# Patient Record
Sex: Female | Born: 1955 | Race: White | Hispanic: No | Marital: Married | Smoking: Never smoker
Health system: Southern US, Community
[De-identification: ages and names within clinical notes are randomized; demographics above are authoritative.]

## PROBLEM LIST (undated history)

## (undated) DIAGNOSIS — K5900 Constipation, unspecified: Secondary | ICD-10-CM

## (undated) DIAGNOSIS — H524 Presbyopia: Secondary | ICD-10-CM

## (undated) DIAGNOSIS — E785 Hyperlipidemia, unspecified: Secondary | ICD-10-CM

## (undated) DIAGNOSIS — I10 Essential (primary) hypertension: Secondary | ICD-10-CM

## (undated) DIAGNOSIS — F419 Anxiety disorder, unspecified: Secondary | ICD-10-CM

## (undated) DIAGNOSIS — Z87442 Personal history of urinary calculi: Secondary | ICD-10-CM

## (undated) DIAGNOSIS — E041 Nontoxic single thyroid nodule: Secondary | ICD-10-CM

## (undated) DIAGNOSIS — K219 Gastro-esophageal reflux disease without esophagitis: Secondary | ICD-10-CM

## (undated) DIAGNOSIS — H52209 Unspecified astigmatism, unspecified eye: Secondary | ICD-10-CM

## (undated) DIAGNOSIS — G4730 Sleep apnea, unspecified: Secondary | ICD-10-CM

## (undated) DIAGNOSIS — M1990 Unspecified osteoarthritis, unspecified site: Secondary | ICD-10-CM

## (undated) DIAGNOSIS — N200 Calculus of kidney: Secondary | ICD-10-CM

## (undated) DIAGNOSIS — C801 Malignant (primary) neoplasm, unspecified: Secondary | ICD-10-CM

## (undated) DIAGNOSIS — M8580 Other specified disorders of bone density and structure, unspecified site: Secondary | ICD-10-CM

## (undated) HISTORY — DX: Presbyopia: H52.4

## (undated) HISTORY — PX: BIOPSY THYROID: PRO38

## (undated) HISTORY — DX: Calculus of kidney: N20.0

## (undated) HISTORY — DX: Other specified disorders of bone density and structure, unspecified site: M85.80

## (undated) HISTORY — DX: Unspecified astigmatism, unspecified eye: H52.209

---

## 1989-11-28 HISTORY — PX: HEMORROIDECTOMY: SUR656

## 1989-11-28 HISTORY — PX: CYSTOSCOPY: SUR368

## 1998-06-11 ENCOUNTER — Ambulatory Visit (HOSPITAL_COMMUNITY): Admission: RE | Admit: 1998-06-11 | Discharge: 1998-06-11 | Payer: Self-pay | Admitting: Internal Medicine

## 1998-06-30 ENCOUNTER — Other Ambulatory Visit: Admission: RE | Admit: 1998-06-30 | Discharge: 1998-06-30 | Payer: Self-pay | Admitting: Obstetrics & Gynecology

## 1998-07-07 ENCOUNTER — Ambulatory Visit (HOSPITAL_COMMUNITY): Admission: RE | Admit: 1998-07-07 | Discharge: 1998-07-07 | Payer: Self-pay | Admitting: Obstetrics & Gynecology

## 1999-07-13 ENCOUNTER — Encounter: Payer: Self-pay | Admitting: Endocrinology

## 1999-07-13 ENCOUNTER — Ambulatory Visit (HOSPITAL_COMMUNITY): Admission: RE | Admit: 1999-07-13 | Discharge: 1999-07-13 | Payer: Self-pay | Admitting: Endocrinology

## 1999-12-18 ENCOUNTER — Emergency Department (HOSPITAL_COMMUNITY): Admission: EM | Admit: 1999-12-18 | Discharge: 1999-12-18 | Payer: Self-pay | Admitting: Emergency Medicine

## 1999-12-19 ENCOUNTER — Encounter: Payer: Self-pay | Admitting: Emergency Medicine

## 1999-12-19 ENCOUNTER — Encounter: Payer: Self-pay | Admitting: Urology

## 1999-12-19 ENCOUNTER — Ambulatory Visit (HOSPITAL_COMMUNITY): Admission: EM | Admit: 1999-12-19 | Discharge: 1999-12-19 | Payer: Self-pay | Admitting: *Deleted

## 2002-11-28 HISTORY — PX: COSMETIC SURGERY: SHX468

## 2008-02-06 ENCOUNTER — Encounter: Admission: RE | Admit: 2008-02-06 | Discharge: 2008-02-06 | Payer: Self-pay | Admitting: Internal Medicine

## 2008-02-21 ENCOUNTER — Encounter: Admission: RE | Admit: 2008-02-21 | Discharge: 2008-02-21 | Payer: Self-pay | Admitting: Internal Medicine

## 2008-02-21 ENCOUNTER — Other Ambulatory Visit: Admission: RE | Admit: 2008-02-21 | Discharge: 2008-02-21 | Payer: Self-pay | Admitting: Interventional Radiology

## 2008-02-21 ENCOUNTER — Encounter (INDEPENDENT_AMBULATORY_CARE_PROVIDER_SITE_OTHER): Payer: Self-pay | Admitting: Interventional Radiology

## 2010-06-29 ENCOUNTER — Encounter: Admission: RE | Admit: 2010-06-29 | Discharge: 2010-06-29 | Payer: Self-pay | Admitting: Obstetrics & Gynecology

## 2010-12-19 ENCOUNTER — Encounter: Payer: Self-pay | Admitting: Internal Medicine

## 2011-07-18 ENCOUNTER — Other Ambulatory Visit: Payer: Self-pay | Admitting: Internal Medicine

## 2011-07-18 DIAGNOSIS — E049 Nontoxic goiter, unspecified: Secondary | ICD-10-CM

## 2011-07-22 ENCOUNTER — Ambulatory Visit
Admission: RE | Admit: 2011-07-22 | Discharge: 2011-07-22 | Disposition: A | Discharge status: Home / Self Care | Payer: 59 | Source: Ambulatory Visit | Attending: Internal Medicine | Admitting: Internal Medicine

## 2011-07-22 DIAGNOSIS — E049 Nontoxic goiter, unspecified: Secondary | ICD-10-CM

## 2012-02-08 ENCOUNTER — Other Ambulatory Visit: Payer: Self-pay | Admitting: Internal Medicine

## 2012-02-14 ENCOUNTER — Other Ambulatory Visit: Payer: Self-pay | Admitting: Obstetrics & Gynecology

## 2012-02-14 ENCOUNTER — Other Ambulatory Visit: Payer: Self-pay

## 2012-02-14 DIAGNOSIS — N644 Mastodynia: Secondary | ICD-10-CM

## 2012-02-21 ENCOUNTER — Ambulatory Visit
Admission: RE | Admit: 2012-02-21 | Discharge: 2012-02-21 | Disposition: A | Discharge status: Home / Self Care | Payer: BC Managed Care – PPO | Source: Ambulatory Visit | Attending: Obstetrics & Gynecology | Admitting: Obstetrics & Gynecology

## 2012-02-21 DIAGNOSIS — N644 Mastodynia: Secondary | ICD-10-CM

## 2014-01-15 ENCOUNTER — Other Ambulatory Visit: Payer: Self-pay | Admitting: Internal Medicine

## 2014-01-15 DIAGNOSIS — E049 Nontoxic goiter, unspecified: Secondary | ICD-10-CM

## 2014-02-11 ENCOUNTER — Other Ambulatory Visit: Payer: BC Managed Care – PPO

## 2014-02-11 ENCOUNTER — Ambulatory Visit
Admission: RE | Admit: 2014-02-11 | Discharge: 2014-02-11 | Disposition: A | Discharge status: Home / Self Care | Payer: BC Managed Care – PPO | Source: Ambulatory Visit | Attending: Internal Medicine | Admitting: Internal Medicine

## 2014-02-11 DIAGNOSIS — E049 Nontoxic goiter, unspecified: Secondary | ICD-10-CM

## 2014-06-03 ENCOUNTER — Other Ambulatory Visit: Payer: Self-pay | Admitting: Endocrinology

## 2014-06-03 DIAGNOSIS — E042 Nontoxic multinodular goiter: Secondary | ICD-10-CM

## 2014-09-02 ENCOUNTER — Ambulatory Visit
Admission: RE | Admit: 2014-09-02 | Discharge: 2014-09-02 | Disposition: A | Discharge status: Home / Self Care | Payer: BC Managed Care – PPO | Source: Ambulatory Visit | Attending: Endocrinology | Admitting: Endocrinology

## 2014-09-02 DIAGNOSIS — E042 Nontoxic multinodular goiter: Secondary | ICD-10-CM

## 2014-09-08 ENCOUNTER — Other Ambulatory Visit: Payer: BC Managed Care – PPO

## 2015-04-23 ENCOUNTER — Other Ambulatory Visit: Payer: Self-pay | Admitting: Endocrinology

## 2015-04-23 DIAGNOSIS — E049 Nontoxic goiter, unspecified: Secondary | ICD-10-CM

## 2015-09-15 ENCOUNTER — Other Ambulatory Visit: Payer: Self-pay

## 2015-09-15 ENCOUNTER — Ambulatory Visit
Admission: RE | Admit: 2015-09-15 | Discharge: 2015-09-15 | Disposition: A | Discharge status: Home / Self Care | Payer: BLUE CROSS/BLUE SHIELD | Source: Ambulatory Visit | Attending: Endocrinology | Admitting: Endocrinology

## 2015-09-15 DIAGNOSIS — E049 Nontoxic goiter, unspecified: Secondary | ICD-10-CM

## 2015-12-17 ENCOUNTER — Encounter (HOSPITAL_COMMUNITY): Payer: Self-pay | Admitting: *Deleted

## 2015-12-17 ENCOUNTER — Emergency Department (HOSPITAL_COMMUNITY)
Admission: EM | Admit: 2015-12-17 | Discharge: 2015-12-17 | Disposition: A | Discharge status: Home / Self Care | Payer: BLUE CROSS/BLUE SHIELD | Attending: Emergency Medicine | Admitting: Emergency Medicine

## 2015-12-17 ENCOUNTER — Emergency Department (HOSPITAL_COMMUNITY): Payer: BLUE CROSS/BLUE SHIELD

## 2015-12-17 DIAGNOSIS — I10 Essential (primary) hypertension: Secondary | ICD-10-CM | POA: Diagnosis not present

## 2015-12-17 DIAGNOSIS — Z79899 Other long term (current) drug therapy: Secondary | ICD-10-CM | POA: Diagnosis not present

## 2015-12-17 DIAGNOSIS — N200 Calculus of kidney: Secondary | ICD-10-CM | POA: Insufficient documentation

## 2015-12-17 DIAGNOSIS — R1031 Right lower quadrant pain: Secondary | ICD-10-CM | POA: Diagnosis present

## 2015-12-17 DIAGNOSIS — R109 Unspecified abdominal pain: Secondary | ICD-10-CM

## 2015-12-17 HISTORY — DX: Essential (primary) hypertension: I10

## 2015-12-17 LAB — URINALYSIS, ROUTINE W REFLEX MICROSCOPIC
Bilirubin Urine: NEGATIVE
Glucose, UA: NEGATIVE mg/dL
Ketones, ur: NEGATIVE mg/dL
LEUKOCYTES UA: NEGATIVE
NITRITE: NEGATIVE
Protein, ur: NEGATIVE mg/dL
SPECIFIC GRAVITY, URINE: 1.02 (ref 1.005–1.030)
pH: 6 (ref 5.0–8.0)

## 2015-12-17 LAB — BASIC METABOLIC PANEL
Anion gap: 11 (ref 5–15)
BUN: 25 mg/dL — AB (ref 6–20)
CHLORIDE: 102 mmol/L (ref 101–111)
CO2: 24 mmol/L (ref 22–32)
CREATININE: 0.96 mg/dL (ref 0.44–1.00)
Calcium: 9.8 mg/dL (ref 8.9–10.3)
GFR calc Af Amer: >60 mL/min (ref >60)
GFR calc non Af Amer: >60 mL/min (ref >60)
Glucose, Bld: 125 mg/dL — ABNORMAL HIGH (ref 65–99)
Potassium: 4.4 mmol/L (ref 3.5–5.1)
SODIUM: 137 mmol/L (ref 135–145)

## 2015-12-17 LAB — CBC
HCT: 41.6 % (ref 36.0–46.0)
HEMOGLOBIN: 13.7 g/dL (ref 12.0–15.0)
MCH: 28.9 pg (ref 26.0–34.0)
MCHC: 32.9 g/dL (ref 30.0–36.0)
MCV: 87.8 fL (ref 78.0–100.0)
PLATELETS: 300 10*3/uL (ref 150–400)
RBC: 4.74 MIL/uL (ref 3.87–5.11)
RDW: 12.6 % (ref 11.5–15.5)
WBC: 14 10*3/uL — AB (ref 4.0–10.5)

## 2015-12-17 LAB — URINE MICROSCOPIC-ADD ON: SQUAMOUS EPITHELIAL / LPF: NONE SEEN

## 2015-12-17 MED ORDER — TAMSULOSIN HCL 0.4 MG PO CAPS: 0.4000 mg | ORAL_CAPSULE | Freq: Every day | ORAL | Status: DC

## 2015-12-17 MED ORDER — ONDANSETRON HCL 8 MG PO TABS
8.0000 mg | ORAL_TABLET | Freq: Three times a day (TID) | ORAL | Status: DC | PRN
Start: 2015-12-17 — End: 2017-03-07

## 2015-12-17 MED ORDER — OXYCODONE-ACETAMINOPHEN 5-325 MG PO TABS
1.0000 | ORAL_TABLET | ORAL | Status: DC | PRN
Start: 2015-12-17 — End: 2017-03-07

## 2015-12-17 NOTE — ED Notes (Addendum)
MD at bedside. 

## 2015-12-17 NOTE — ED Notes (Signed)
Pt complains of right flank pain and nausea since Tuesday . Pt states she believes she has a kidney stone. Pt states she last had kidney stone 25 years ago. Pt states she has not had urinary symptoms.

## 2015-12-17 NOTE — Discharge Instructions (Signed)
You have a 8 x 7 mm kidney stone in your right ureter. You will need to see the urologist. Phone number given. Call the office tomorrow morning for follow-up. Prescriptions for pain medication, nausea medication, and medication to increase your urine flow.

## 2015-12-17 NOTE — ED Notes (Signed)
MD at bedside. 

## 2015-12-17 NOTE — ED Notes (Signed)
Pt ambulated to CT

## 2015-12-17 NOTE — ED Provider Notes (Signed)
CSN: [id number]     Arrival date & time 12/17/15  1547 History   First MD Initiated Contact with Patient 12/17/15 2030     Chief Complaint  Patient presents with  . Flank Pain     (Consider location/radiation/quality/duration/timing/severity/associated sxs/prior Treatment) HPI...Marland KitchenMarland KitchenMarland Kitchen right flank pain for 2-3 days with radiation to right lower quadrant. No dysuria, hematuria, fever, chills. Past medical history includes a kidney stone approximately 25 years ago. Pain waxes and wanes. Pain was very severe approximately 2 hours ago.  Nothing makes sxs better or worse.  Past Medical History  Diagnosis Date  . Hypertension    History reviewed. No pertinent past surgical history. No family history on file. Social History  Substance Use Topics  . Smoking status: Never Smoker   . Smokeless tobacco: None  . Alcohol Use: Yes   OB History    No data available     Review of Systems  All other systems reviewed and are negative.     Allergies  Erythromycin  Home Medications   Prior to Admission medications   Medication Sig Start Date End Date Taking? Authorizing Provider  atorvastatin (LIPITOR) 40 MG tablet Take 40 mg by mouth at bedtime. 12/14/15  Yes Historical Provider, MD  calcium carbonate (OS-CAL - DOSED IN MG OF ELEMENTAL CALCIUM) 1250 (500 Ca) MG tablet Take 1 tablet by mouth daily with breakfast.   Yes Historical Provider, MD  cholecalciferol (VITAMIN D) 1000 units tablet Take 1,000 Units by mouth daily.   Yes Historical Provider, MD  lisinopril-hydrochlorothiazide (PRINZIDE,ZESTORETIC) 10-12.5 MG tablet Take 1 tablet by mouth daily. 10/19/15  Yes Historical Provider, MD  omega-3 acid ethyl esters (LOVAZA) 1 g capsule Take 1 g by mouth daily.   Yes Historical Provider, MD  ondansetron (ZOFRAN) 8 MG tablet Take 1 tablet (8 mg total) by mouth every 8 (eight) hours as needed for nausea or vomiting. 12/17/15   Nat Christen, MD  oxyCODONE-acetaminophen (PERCOCET) 5-325 MG tablet Take  1-2 tablets by mouth every 4 (four) hours as needed. 12/17/15   Nat Christen, MD  tamsulosin (FLOMAX) 0.4 MG CAPS capsule Take 1 capsule (0.4 mg total) by mouth daily. 12/17/15   Nat Christen, MD   BP 132/94 mmHg  Pulse 84  Temp(Src) 99.4 F (37.4 C) (Oral)  Resp 18  SpO2 98% Physical Exam  Constitutional: She is oriented to person, place, and time. She appears well-developed and well-nourished.  HENT:  Head: Normocephalic and atraumatic.  Eyes: Conjunctivae and EOM are normal. Pupils are equal, round, and reactive to light.  Neck: Normal range of motion. Neck supple.  Cardiovascular: Normal rate and regular rhythm.   Pulmonary/Chest: Effort normal and breath sounds normal.  Abdominal: Soft. Bowel sounds are normal.  Genitourinary:  Tender right flank  Musculoskeletal: Normal range of motion.  Neurological: She is alert and oriented to person, place, and time.  Skin: Skin is warm and dry.  Psychiatric: She has a normal mood and affect. Her behavior is normal.  Nursing note and vitals reviewed.   ED Course  Procedures (including critical care time) Labs Review Labs Reviewed  URINALYSIS, ROUTINE W REFLEX MICROSCOPIC (NOT AT [facility]) - Abnormal; Notable for the following:    Hgb urine dipstick LARGE (*)    All other components within normal limits  CBC - Abnormal; Notable for the following:    WBC 14.0 (*)    All other components within normal limits  BASIC METABOLIC PANEL - Abnormal; Notable for the following:  Glucose, Bld 125 (*)    BUN 25 (*)    All other components within normal limits  URINE MICROSCOPIC-ADD ON - Abnormal; Notable for the following:    Bacteria, UA RARE (*)    All other components within normal limits    Imaging Review Ct Renal Stone Study  12/17/2015  CLINICAL DATA:  Acute onset of right flank and pelvic pain. Initial encounter. EXAM: CT ABDOMEN AND PELVIS WITHOUT CONTRAST TECHNIQUE: Multidetector CT imaging of the abdomen and pelvis was performed  following the standard protocol without IV contrast. COMPARISON:  None. FINDINGS: The visualized lung bases are clear. The liver and spleen are unremarkable in appearance. The gallbladder is within normal limits. The pancreas and adrenal glands are unremarkable. Mild-to-moderate right-sided hydronephrosis is noted, with right-sided perinephric stranding and fluid, and prominence of the proximal ureter to a level of an obstructing 8 x 7 mm stone in the proximal right ureter, 4 cm below the right renal pelvis. Scattered nonobstructing bilateral renal stones are seen, measuring up to 4 mm in size. No free fluid is identified. The small bowel is unremarkable in appearance. The stomach is within normal limits. No acute vascular abnormalities are seen. Mild calcification is noted along the abdominal aorta and its branches. The appendix is decompressed and not well assessed. There is no evidence for appendicitis. The colon is unremarkable in appearance. The sigmoid colon is somewhat redundant. The bladder is mildly distended and grossly unremarkable. The uterus is unremarkable in appearance. The ovaries are relatively symmetric. No suspicious adnexal masses are seen. No inguinal lymphadenopathy is seen. No acute osseous abnormalities are identified. Vacuum phenomenon and disc space narrowing are noted at L5-S1. IMPRESSION: 1. Mild to moderate right-sided hydronephrosis, with an obstructing 8 x 7 mm stone in the proximal right ureter, 4 cm below the right renal pelvis. 2. Scattered nonobstructing bilateral renal stones, measuring up to 4 mm in size. 3. Mild calcification along the abdominal aorta and its branches. Electronically Signed   By: Garald Balding M.D.   On: 12/17/2015 21:38   I have personally reviewed and evaluated these images and lab results as part of my medical decision-making.   EKG Interpretation None      MDM   Final diagnoses:  Right flank pain  Right kidney stone    No acute abdomen. CT  scan shows an 8 x 7 mm stone in the proximal right ureter 4 cm below the renal pelvis with mild to moderate hydronephrosis. Patient is pain-free at this time. I discussed the situation with the urologist on call. She will be seen tomorrow. Discussed with patient and her husband.    Nat Christen, MD 12/17/15 2300

## 2015-12-21 ENCOUNTER — Ambulatory Visit (HOSPITAL_COMMUNITY): Payer: BLUE CROSS/BLUE SHIELD | Admitting: Certified Registered"

## 2015-12-21 ENCOUNTER — Encounter (HOSPITAL_COMMUNITY): Payer: Self-pay | Admitting: *Deleted

## 2015-12-21 ENCOUNTER — Ambulatory Visit (HOSPITAL_COMMUNITY)
Admission: AD | Admit: 2015-12-21 | Discharge: 2015-12-21 | Disposition: A | Discharge status: Home / Self Care | Payer: BLUE CROSS/BLUE SHIELD | Source: Ambulatory Visit | Attending: Urology | Admitting: Urology

## 2015-12-21 ENCOUNTER — Encounter (HOSPITAL_COMMUNITY)
Admission: AD | Disposition: A | Discharge status: Home / Self Care | Payer: Self-pay | Source: Ambulatory Visit | Attending: Urology

## 2015-12-21 ENCOUNTER — Other Ambulatory Visit: Payer: Self-pay | Admitting: Urology

## 2015-12-21 DIAGNOSIS — N201 Calculus of ureter: Secondary | ICD-10-CM

## 2015-12-21 DIAGNOSIS — I10 Essential (primary) hypertension: Secondary | ICD-10-CM | POA: Diagnosis not present

## 2015-12-21 DIAGNOSIS — E7800 Pure hypercholesterolemia, unspecified: Secondary | ICD-10-CM | POA: Diagnosis not present

## 2015-12-21 DIAGNOSIS — N132 Hydronephrosis with renal and ureteral calculous obstruction: Secondary | ICD-10-CM | POA: Diagnosis not present

## 2015-12-21 DIAGNOSIS — Z79899 Other long term (current) drug therapy: Secondary | ICD-10-CM | POA: Insufficient documentation

## 2015-12-21 DIAGNOSIS — G4730 Sleep apnea, unspecified: Secondary | ICD-10-CM | POA: Diagnosis not present

## 2015-12-21 HISTORY — PX: CYSTOSCOPY/URETEROSCOPY/HOLMIUM LASER: SHX6545

## 2015-12-21 HISTORY — PX: HOLMIUM LASER APPLICATION: SHX5852

## 2015-12-21 SURGERY — CYSTOURETEROSCOPY, USING HOLMIUM LASER
Anesthesia: General | Laterality: Right

## 2015-12-21 MED ORDER — FENTANYL CITRATE (PF) 100 MCG/2ML IJ SOLN
INTRAMUSCULAR | Status: AC
  Filled 2015-12-21: qty 2

## 2015-12-21 MED ORDER — IOHEXOL 300 MG/ML  SOLN
INTRAMUSCULAR | Status: DC | PRN
  Administered 2015-12-21: 5 mL

## 2015-12-21 MED ORDER — KETOROLAC TROMETHAMINE 30 MG/ML IJ SOLN
INTRAMUSCULAR | Status: DC | PRN
  Administered 2015-12-21: 30 mg via INTRAVENOUS

## 2015-12-21 MED ORDER — BELLADONNA ALKALOIDS-OPIUM 16.2-60 MG RE SUPP
RECTAL | Status: DC | PRN
  Administered 2015-12-21: 1 via RECTAL

## 2015-12-21 MED ORDER — PROPOFOL 10 MG/ML IV BOLUS
INTRAVENOUS | Status: AC
  Filled 2015-12-21: qty 20

## 2015-12-21 MED ORDER — HYOSCYAMINE SULFATE 0.125 MG SL SUBL: 0.1250 mg | SUBLINGUAL_TABLET | SUBLINGUAL | Status: DC | PRN

## 2015-12-21 MED ORDER — ONDANSETRON HCL 4 MG PO TABS: 4.0000 mg | ORAL_TABLET | Freq: Three times a day (TID) | ORAL | Status: DC | PRN

## 2015-12-21 MED ORDER — TRIMETHOPRIM 100 MG PO TABS: 100.0000 mg | ORAL_TABLET | ORAL | Status: DC

## 2015-12-21 MED ORDER — SODIUM CHLORIDE 0.9 % IR SOLN
Status: DC | PRN
  Administered 2015-12-21: 5000 mL

## 2015-12-21 MED ORDER — TRAMADOL-ACETAMINOPHEN 37.5-325 MG PO TABS: 1.0000 | ORAL_TABLET | Freq: Four times a day (QID) | ORAL | Status: DC | PRN

## 2015-12-21 MED ORDER — CEFAZOLIN SODIUM 1-5 GM-% IV SOLN: 1.0000 g | INTRAVENOUS | Status: DC

## 2015-12-21 MED ORDER — BELLADONNA ALKALOIDS-OPIUM 16.2-60 MG RE SUPP
RECTAL | Status: AC
  Filled 2015-12-21: qty 1

## 2015-12-21 MED ORDER — PROMETHAZINE HCL 25 MG/ML IJ SOLN: 6.2500 mg | INTRAMUSCULAR | Status: DC | PRN

## 2015-12-21 MED ORDER — LACTATED RINGERS IV SOLN
INTRAVENOUS | Status: DC | PRN
  Administered 2015-12-21: 20:00:00 via INTRAVENOUS

## 2015-12-21 MED ORDER — LIDOCAINE HCL (CARDIAC) 20 MG/ML IV SOLN
INTRAVENOUS | Status: DC | PRN
  Administered 2015-12-21: 100 mg via INTRAVENOUS

## 2015-12-21 MED ORDER — LIDOCAINE HCL (CARDIAC) 20 MG/ML IV SOLN
INTRAVENOUS | Status: AC
  Filled 2015-12-21: qty 5

## 2015-12-21 MED ORDER — ACETAMINOPHEN 10 MG/ML IV SOLN
INTRAVENOUS | Status: DC | PRN
  Administered 2015-12-21: 1000 mg via INTRAVENOUS

## 2015-12-21 MED ORDER — DEXAMETHASONE SODIUM PHOSPHATE 10 MG/ML IJ SOLN
INTRAMUSCULAR | Status: DC | PRN
  Administered 2015-12-21: 10 mg via INTRAVENOUS

## 2015-12-21 MED ORDER — ACETAMINOPHEN 10 MG/ML IV SOLN
INTRAVENOUS | Status: AC
  Filled 2015-12-21: qty 100

## 2015-12-21 MED ORDER — PHENYLEPHRINE HCL 10 MG/ML IJ SOLN
INTRAMUSCULAR | Status: DC | PRN
  Administered 2015-12-21 (×3): 80 ug via INTRAVENOUS

## 2015-12-21 MED ORDER — ONDANSETRON HCL 4 MG/2ML IJ SOLN
INTRAMUSCULAR | Status: DC | PRN
  Administered 2015-12-21: 4 mg via INTRAVENOUS

## 2015-12-21 MED ORDER — CEFAZOLIN SODIUM-DEXTROSE 2-3 GM-% IV SOLR
2.0000 g | INTRAVENOUS | Status: AC
Start: 2015-12-21 — End: 2015-12-21
  Administered 2015-12-21: 2 g via INTRAVENOUS
  Filled 2015-12-21: qty 50

## 2015-12-21 MED ORDER — KETOROLAC TROMETHAMINE 30 MG/ML IJ SOLN
INTRAMUSCULAR | Status: AC
  Filled 2015-12-21: qty 1

## 2015-12-21 MED ORDER — 0.9 % SODIUM CHLORIDE (POUR BTL) OPTIME
TOPICAL | Status: DC | PRN
  Administered 2015-12-21: 1000 mL

## 2015-12-21 MED ORDER — LIDOCAINE HCL 2 % EX GEL
CUTANEOUS | Status: AC
  Filled 2015-12-21: qty 5

## 2015-12-21 MED ORDER — HYDROMORPHONE HCL 1 MG/ML IJ SOLN: 0.2500 mg | INTRAMUSCULAR | Status: DC | PRN

## 2015-12-21 MED ORDER — DEXAMETHASONE SODIUM PHOSPHATE 10 MG/ML IJ SOLN
INTRAMUSCULAR | Status: AC
  Filled 2015-12-21: qty 1

## 2015-12-21 MED ORDER — FENTANYL CITRATE (PF) 100 MCG/2ML IJ SOLN
INTRAMUSCULAR | Status: DC | PRN
  Administered 2015-12-21 (×4): 25 ug via INTRAVENOUS

## 2015-12-21 MED ORDER — PROPOFOL 10 MG/ML IV BOLUS
INTRAVENOUS | Status: DC | PRN
  Administered 2015-12-21: 160 mg via INTRAVENOUS

## 2015-12-21 MED ORDER — CEFAZOLIN SODIUM-DEXTROSE 2-3 GM-% IV SOLR
INTRAVENOUS | Status: AC
  Filled 2015-12-21: qty 50

## 2015-12-21 MED ORDER — ONDANSETRON HCL 4 MG/2ML IJ SOLN
INTRAMUSCULAR | Status: AC
  Filled 2015-12-21: qty 2

## 2015-12-21 MED ORDER — SODIUM CHLORIDE 0.9 % IJ SOLN
INTRAMUSCULAR | Status: AC
  Filled 2015-12-21: qty 10

## 2015-12-21 SURGICAL SUPPLY — 13 items
BAG URO CATCHER STRL LF (MISCELLANEOUS) ×2 IMPLANT
BASKET ZERO TIP NITINOL 2.4FR (BASKET) ×2 IMPLANT
BSKT STON RTRVL ZERO TP 2.4FR (BASKET) ×2
CATH INTERMIT  6FR 70CM (CATHETERS) ×2 IMPLANT
CLOTH BEACON ORANGE TIMEOUT ST (SAFETY) ×2 IMPLANT
FIBER LASER FLEXIVA 200 (UROLOGICAL SUPPLIES) ×1 IMPLANT
GLOVE BIOGEL M STRL SZ7.5 (GLOVE) ×2 IMPLANT
GOWN STRL REUS W/TWL XL LVL3 (GOWN DISPOSABLE) ×2 IMPLANT
GUIDEWIRE STR DUAL SENSOR (WIRE) ×2 IMPLANT
MANIFOLD NEPTUNE II (INSTRUMENTS) ×2 IMPLANT
PACK CYSTO (CUSTOM PROCEDURE TRAY) ×2 IMPLANT
STENT CONTOUR 6FRX24X.038 (STENTS) ×1 IMPLANT
TUBING CONNECTING 10 (TUBING) ×2 IMPLANT

## 2015-12-21 NOTE — Interval H&P Note (Signed)
History and Physical Interval Note:  12/21/2015 8:22 PM  Julia Sloan  has presented today for surgery, with the diagnosis of right mid ureteral stone  The various methods of treatment have been discussed with the patient and family. After consideration of risks, benefits and other options for treatment, the patient has consented to  Procedure(s): CYSTOSCOPY, RETROGRADE PYELOGRAM,  URETEROSCOPY, BASKET STONE EXTRACTION  (Right) HOLMIUM LASER APPLICATION (Right) as a surgical intervention .  The patient's history has been reviewed, patient examined, no change in status, stable for surgery.  I have reviewed the patient's chart and labs.  Questions were answered to the patient's satisfaction.     Cobe Viney I Alveta Quintela

## 2015-12-21 NOTE — Anesthesia Postprocedure Evaluation (Signed)
Anesthesia Post Note  Patient: REGENE EAPEN  Procedure(s) Performed: Procedure(s) (LRB): CYSTOSCOPY, RETROGRADE PYELOGRAM,  URETEROSCOPY, BASKET STONE EXTRACTION, INSERTION DOUBLE J STENT  (Right) HOLMIUM LASER APPLICATION (Right)  Patient location during evaluation: PACU Anesthesia Type: General Level of consciousness: awake Pain management: pain level controlled Respiratory status: spontaneous breathing Cardiovascular status: stable Anesthetic complications: no    Last Vitals:  Filed Vitals:   12/21/15 2130 12/21/15 2145  BP: 138/77 129/75  Pulse: 63 60  Temp:    Resp: 12 20    Last Pain:  Filed Vitals:   12/21/15 2151  PainSc: 0-No pain                 EDWARDS,Kamree Wiens

## 2015-12-21 NOTE — Op Note (Signed)
Pre-operative diagnosis :  Right 26mm mid-ureteral stone  Postoperative diagnosis:  Same  Operation:  Cysto, right retrograde pyelogram with interpretation, right ureteroscopy, laser of 17mm right mid-ureteralstone, basket extraction of stone fragments. Right JJ stent with suture attachment ( 27F x 24 cm).   Surgeon:  Chauncey Cruel. Gaynelle Arabian, MD  First assistant:  None  Anesthesia:  General LMA  Preparation:  After appropriate preanesthesia, the patient was brought to the operative room, placed on the operating table in the dorsal supine position where general LMA anesthesia was introduced. She was then replaced in the dorsal lithotomy position. Pubis was prepped with Betadine solution and draped in usual fashion. The history was double checked. The armband was double checked. The patient was marked in her right arm, and right flank.  Review history:  Active Problems Problems  1. Calculus of right ureter (N20.1) 2. Kidney stone on right side (N20.0) 3. Nephrolithiasis (N20.0)  History of Present Illness    60 yo female, pt of Dr. Deland Pretty, referred by Dr. Lacinda Axon (ER) for further evaluation of kidney stone. Pt noted brief episode of Right flank pain-Level 3-4- Tuesday AM, without nausea or vomiting. Pain went away , but recurred Tuesday afternoon, with pain in the bilateral groin, Level 2-3. Pain went away again until Wednsday, with recurrent dull ache in s-p area, with ensuing severe flank pain Thursday at 3 pm, pain level 10/10, with nausea and vomiting. She was seen in the [facility] on 12/17/15 for RLQ abdominal pain with a hx of kidney stones. CT showed an 8 x 7 mm proximal Rt ureteral stone with mild/moderate hydronephrosis and bilateral renal stones. She was treated with oxycodone, Zofran, and flomax-which she has not taken.   Diet: fast foods: weekly.   Sodas: hx of diet Coke. Now occasional only. Tea: occasional only,.   She is married, and P 2-2-0.    Statement of  Likelihood of Success:  Excellent. TIME-OUT observed.:  Procedure:  Cystourethroscopy was accomplished, showing normal appearing bladder, with clear eflux from both orifices. The right ureteral orifice was cannulated, and right retrograde pyelogram was performed, showing stone in the right mid ureter. A 0.038 guidewire was passed around the stone, and coiled in the right renal pelvis. The 6.5 French semirigid ureteroscope was then passed through the right ureteral orifice, alongside the wire, and into the right mid ureter, where the 7 mm stone was identified. The stone was multifaceted. The stone appeared impacted in the ureter, and therefore, I elected to use a laser, with laser fiber of 220, and laser settings of 10/0.5, and 5/0.5, with fragmentation of the stone. The fragments were then basket extracted. At first, the fragments were too large to basket extract, so therefore the fragments were captured in the basket, and the basket cut and clamped. The ureteroscope was then repassed, and the laser was used to fragment the stone within the basket. A second 0 tip basket was then passed, and the first basket was removed, and all stone fragments were then removed. Ureteroscopy was then Compass, showing the ureter free of any stone fragments or clot. The mid ureter was edematous, and I elected to place a double-J stent, and therefore, a 6 [place] by 24 cm double-J stent was placed without difficulty, under fluoroscopic control, with coil in the renal pelvis and the bladder. The suture was left on the end of the double-J stent, and through the urethra, and taped to the pubis. The patient had been no suppository at the beginning of the  procedure, as well as IV Ancef. She was awakened after given IV Toradol, and taken to recovery room in good condition.

## 2015-12-21 NOTE — Discharge Instructions (Addendum)
Dietary Guidelines to Help Prevent Kidney Stones Your risk of kidney stones can be decreased by adjusting the foods you eat. The most important thing you can do is drink enough fluid. You should drink enough fluid to keep your urine clear or pale yellow. The following guidelines provide specific information for the type of kidney stone you have had. GUIDELINES ACCORDING TO TYPE OF KIDNEY STONE Calcium Oxalate Kidney Stones  Reduce the amount of salt you eat. Foods that have a lot of salt cause your body to release excess calcium into your urine. The excess calcium can combine with a substance called oxalate to form kidney stones.  Reduce the amount of animal protein you eat if the amount you eat is excessive. Animal protein causes your body to release excess calcium into your urine. Ask your dietitian how much protein from animal sources you should be eating.  Avoid foods that are high in oxalates. If you take vitamins, they should have less than 500 mg of vitamin C. Your body turns vitamin C into oxalates. You do not need to avoid fruits and vegetables high in vitamin C. Calcium Phosphate Kidney Stones  Reduce the amount of salt you eat to help prevent the release of excess calcium into your urine.  Reduce the amount of animal protein you eat if the amount you eat is excessive. Animal protein causes your body to release excess calcium into your urine. Ask your dietitian how much protein from animal sources you should be eating.  Get enough calcium from food or take a calcium supplement (ask your dietitian for recommendations). Food sources of calcium that do not increase your risk of kidney stones include:  Broccoli.  Dairy products, such as cheese and yogurt.  Pudding. Uric Acid Kidney Stones  Do not have more than 6 oz of animal protein per day. FOOD SOURCES Animal Protein Sources  Meat (all types).  Poultry.  Eggs.  Fish, seafood. Foods High in [employer] seasonings.  Soy  sauce.  Teriyaki sauce.  Cured and processed meats.  Salted crackers and snack foods.  Fast food.  Canned soups and most canned foods. Foods High in Oxalates  Grains:  Amaranth.  Barley.  Grits.  Wheat germ.  Bran.  Buckwheat flour.  All bran cereals.  Pretzels.  Whole wheat bread.  Vegetables:  Beans (wax).  Beets and beet greens.  Collard greens.  Eggplant.  [place].  Leeks.  Okra.  Parsley.  Rutabagas.  Spinach.  Swiss chard.  Tomato paste.  Fried potatoes.  Sweet potatoes.  Fruits:  Red currants.  Figs.  Kiwi.  Rhubarb.  Meat and Other Protein Sources:  Beans (dried).  Soy burgers and other soybean products.  Miso.  Nuts (peanuts, almonds, pecans, cashews, hazelnuts).  Nut butters.  Sesame seeds and tahini (paste made of sesame seeds).  Poppy seeds.  Beverages:  Chocolate drink mixes.  Soy milk.  Instant iced tea.  Juices made from high-oxalate fruits or vegetables.  Other:  Carob.  Chocolate.  Fruitcake.  Marmalades.   This information is not intended to replace advice given to you by your health care provider. Make sure you discuss any questions you have with your health care provider.  Remove JJ stent by pulling suture, on Friday AM. Then discard. Ok to return to work on Monday, 12/30/2014.  Document Released: 03/11/2011 Document Revised: 11/19/2013 Document Reviewed: 10/11/2013 Elsevier Interactive Patient Education [employer].

## 2015-12-21 NOTE — Anesthesia Procedure Notes (Signed)
Procedure Name: LMA Insertion Date/Time: 12/21/2015 8:24 PM Performed by: Danley Danker L Patient Re-evaluated:Patient Re-evaluated prior to inductionOxygen Delivery Method: Circle system utilized Preoxygenation: Pre-oxygenation with 100% oxygen Intubation Type: IV induction Ventilation: Mask ventilation without difficulty LMA: LMA inserted LMA Size: 4.0 Number of attempts: 1 Placement Confirmation: positive ETCO2 and breath sounds checked- equal and bilateral Tube secured with: Tape Dental Injury: Teeth and Oropharynx as per pre-operative assessment

## 2015-12-21 NOTE — Anesthesia Preprocedure Evaluation (Signed)
Anesthesia Evaluation  Patient identified by MRN, date of birth, ID band Patient awake    Reviewed: Allergy & Precautions, NPO status , Patient's Chart, lab work & pertinent test results  Airway Mallampati: II  TM Distance: >3 FB Neck ROM: Full    Dental   Pulmonary neg pulmonary ROS,  breath sounds clear to auscultation        Cardiovascular hypertension, Rhythm:Regular Rate:Normal     Neuro/Psych    GI/Hepatic negative GI ROS, Neg liver ROS,   Endo/Other  negative endocrine ROS  Renal/GU negative Renal ROS     Musculoskeletal   Abdominal   Peds  Hematology   Anesthesia Other Findings   Reproductive/Obstetrics                            Anesthesia Physical Anesthesia Plan  ASA: III  Anesthesia Plan: General   Post-op Pain Management:    Induction: Intravenous  Airway Management Planned: LMA  Additional Equipment:   Intra-op Plan:   Post-operative Plan: Extubation in OR  Informed Consent: I have reviewed the patients History and Physical, chart, labs and discussed the procedure including the risks, benefits and alternatives for the proposed anesthesia with the patient or authorized representative who has indicated his/her understanding and acceptance.   Dental advisory given  Plan Discussed with: CRNA and Anesthesiologist  Anesthesia Plan Comments:         Anesthesia Quick Evaluation  

## 2015-12-21 NOTE — H&P (Signed)
Reason For Visit Kidney stone   Active Problems Problems  1. Calculus of right ureter (N20.1) 2. Kidney stone on right side (N20.0) 3. Nephrolithiasis (N20.0)  History of Present Illness     60 yo female, pt of Dr. Deland Pretty, referred by Dr. Lacinda Axon (ER) for further evaluation of kidney stone. Pt noted brief episode of Right flank pain-Level 3-4- Tuesday AM, without nausea or vomiting. Pain went away , but recurred Tuesday afternoon, with pain in the bilateral groin, Level 2-3. Pain went away again until Wednsday, with recurrent dull ache in s-p area, with ensuing severe flank pain Thursday at 3 pm, pain level 10/10, with nausea and vomiting.  She was seen in the [facility] on 12/17/15 for RLQ abdominal pain with a hx of kidney stones. CT showed an 8 x 7 mm proximal Rt ureteral stone with mild/moderate hydronephrosis and bilateral renal stones. She was treated with oxycodone, Zofran, and flomax-which she has not taken.   Diet: fast foods: weekly.   Sodas: hx of diet Coke. Now occasional only. Tea: occasional only,.   She is married, and P 2-2-0.   Past Medical History Problems  1. History of hypercholesterolemia (Z86.39) 2. History of hypertension (Z86.79) 3. History of sleep apnea (Z86.69)  Surgical History Problems  1. History of Cesarean Section 2. History of Cystoscopy With Ureteroscopy With Manipulation Of Calculus 3. History of Neck Surgery 4. History of Skin Debridement  Current Meds 1. Atorvastatin Calcium 40 MG Oral Tablet;  Therapy: (Recorded:20Jan2017) to Recorded 2. Calcium Acetate CAPS;  Therapy: (Recorded:20Jan2017) to Recorded 3. Fish Oil CAPS;  Therapy: (Recorded:20Jan2017) to Recorded 4. Lisinopril-Hydrochlorothiazide 10-12.5 MG Oral Tablet;  Therapy: (Recorded:20Jan2017) to Recorded 5. Vitamin D3 CAPS;  Therapy: (Recorded:20Jan2017) to Recorded  Allergies Medication  1. Erythromycin TABS 2. Tetracyclines  Family History Problems  1. Family history of  cerebrovascular accident (CVA) (Z82.3) : Mother 2. Family history of hypercholesterolemia (Z83.42) : Mother, Father 3. Family history of hypertension (Z82.49) : Father 4. Family history of osteoporosis (Z82.62) : Mother 5. Family history of rheumatoid arthritis (Z82.61) : Mother 6. Family history of scleroderma (Z82.69) : Mother  Social History Problems    Alcohol use (Z78.9)   Caffeine use (F15.90)   Father deceased   Married   Mother deceased   Never a smoker   Number of children   Occupation  Review of Systems Genitourinary, constitutional, skin, eye, otolaryngeal, hematologic/lymphatic, cardiovascular, pulmonary, endocrine, musculoskeletal, gastrointestinal, neurological and psychiatric system(s) were reviewed and pertinent findings if present are noted and are otherwise negative.  Genitourinary: nocturia and hematuria.  Gastrointestinal: nausea, vomiting, flank pain, abdominal pain and heartburn.  Constitutional: fever and feeling tired (fatigue).  Musculoskeletal: back pain.  Neurological: headache.    Vitals Vital Signs [Data Includes: Last 1 Day]  Recorded: 20Jan2017 03:30PM  Height: 5 ft 1.5 in Weight: 158 lb  BMI Calculated: 29.37 BSA Calculated: 1.72 Blood Pressure: 132 / 86 Temperature: 98.2 F Heart Rate: 79 Respiration: 18  Physical Exam Constitutional: Well nourished and well developed . No acute distress.  ENT:. The ears and nose are normal in appearance.  Neck: The appearance of the neck is normal and no neck mass is present.  Pulmonary: No respiratory distress and normal respiratory rhythm and effort.  Cardiovascular: Heart rate and rhythm are normal . No peripheral edema.  Abdomen: The abdomen is soft and nontender. No masses are palpated. No CVA tenderness. No hernias are palpable. No hepatosplenomegaly noted.  Lymphatics: The femoral and inguinal nodes are not  enlarged or tender.  Skin: Normal skin turgor, no visible rash and no visible skin  lesions.  Neuro/Psych:. Mood and affect are appropriate.    Results/Data Urine [Data Includes: Last 1 Day]   QN:[id number]  COLOR YELLOW   APPEARANCE CLEAR   SPECIFIC GRAVITY 1.015   pH 6.5   GLUCOSE NEGATIVE   BILIRUBIN NEGATIVE   KETONE NEGATIVE   BLOOD 2+   PROTEIN NEGATIVE   NITRITE NEGATIVE   LEUKOCYTE ESTERASE 1+   SQUAMOUS EPITHELIAL/HPF 0-5 HPF  WBC 0-5 WBC/HPF  RBC 20-40 RBC/HPF  BACTERIA NONE SEEN HPF  CRYSTALS See Below HPF  CASTS NONE SEEN LPF  Yeast NONE SEEN HPF   Assessment Assessed  1. Calculus of right ureter (N20.1) 2. Kidney stone on right side (N20.0) 3. Nephrolithiasis (N20.0)  Right mid ureteral stone. She will be an add-on for Monday evening for cysto, right retrograde, and laser of stone and basket extraction and JJ stent.   Plan Health Maintenance  1. UA With REFLEX; [Do Not Release]; Status:Resulted - Requires Verification;   DoneIS:[id number] 02:45PM  surgery for Monday..Flomax.   Discussion/Summary cc: Dr. Deland Pretty     Signatures Electronically signed by : Carolan Clines, M.D.; Dec 18 2015  5:05PM EST

## 2015-12-21 NOTE — Transfer of Care (Signed)
Immediate Anesthesia Transfer of Care Note  Patient: Julia Sloan  Procedure(s) Performed: Procedure(s): CYSTOSCOPY, RETROGRADE PYELOGRAM,  URETEROSCOPY, BASKET STONE EXTRACTION, INSERTION DOUBLE J STENT  (Right) HOLMIUM LASER APPLICATION (Right)  Patient Location: PACU  Anesthesia Type:General  Level of Consciousness: awake, alert  and oriented  Airway & Oxygen Therapy: Patient Spontanous Breathing and Patient connected to nasal cannula oxygen  Post-op Assessment: Report given to RN and Post -op Vital signs reviewed and stable  Post vital signs: Reviewed and stable  Last Vitals:  Filed Vitals:   12/21/15 1655  BP: 135/81  Pulse: 81  Temp: 36.9 C  Resp: 16    Complications: No apparent anesthesia complications

## 2015-12-22 ENCOUNTER — Encounter (HOSPITAL_COMMUNITY): Payer: Self-pay | Admitting: Urology

## 2016-07-06 DIAGNOSIS — Z23 Encounter for immunization: Secondary | ICD-10-CM | POA: Diagnosis not present

## 2016-07-18 DIAGNOSIS — E7800 Pure hypercholesterolemia, unspecified: Secondary | ICD-10-CM | POA: Diagnosis not present

## 2016-07-18 DIAGNOSIS — Z0000 Encounter for general adult medical examination without abnormal findings: Secondary | ICD-10-CM | POA: Diagnosis not present

## 2016-07-18 DIAGNOSIS — E559 Vitamin D deficiency, unspecified: Secondary | ICD-10-CM | POA: Diagnosis not present

## 2016-07-20 DIAGNOSIS — Z0000 Encounter for general adult medical examination without abnormal findings: Secondary | ICD-10-CM | POA: Diagnosis not present

## 2016-07-20 DIAGNOSIS — M8580 Other specified disorders of bone density and structure, unspecified site: Secondary | ICD-10-CM | POA: Diagnosis not present

## 2016-07-20 DIAGNOSIS — I10 Essential (primary) hypertension: Secondary | ICD-10-CM | POA: Diagnosis not present

## 2016-08-24 DIAGNOSIS — N390 Urinary tract infection, site not specified: Secondary | ICD-10-CM | POA: Diagnosis not present

## 2016-08-24 DIAGNOSIS — R300 Dysuria: Secondary | ICD-10-CM | POA: Diagnosis not present

## 2016-09-26 DIAGNOSIS — E049 Nontoxic goiter, unspecified: Secondary | ICD-10-CM | POA: Diagnosis not present

## 2016-09-26 DIAGNOSIS — E1165 Type 2 diabetes mellitus with hyperglycemia: Secondary | ICD-10-CM | POA: Diagnosis not present

## 2016-10-03 DIAGNOSIS — E049 Nontoxic goiter, unspecified: Secondary | ICD-10-CM | POA: Diagnosis not present

## 2017-01-26 DIAGNOSIS — R1011 Right upper quadrant pain: Secondary | ICD-10-CM | POA: Diagnosis not present

## 2017-01-27 ENCOUNTER — Other Ambulatory Visit: Payer: Self-pay | Admitting: Internal Medicine

## 2017-01-27 DIAGNOSIS — R1011 Right upper quadrant pain: Secondary | ICD-10-CM

## 2017-02-06 ENCOUNTER — Ambulatory Visit
Admission: RE | Admit: 2017-02-06 | Discharge: 2017-02-06 | Disposition: A | Discharge status: Home / Self Care | Payer: BLUE CROSS/BLUE SHIELD | Source: Ambulatory Visit | Attending: Internal Medicine | Admitting: Internal Medicine

## 2017-02-06 DIAGNOSIS — R1011 Right upper quadrant pain: Secondary | ICD-10-CM

## 2017-02-06 DIAGNOSIS — K8020 Calculus of gallbladder without cholecystitis without obstruction: Secondary | ICD-10-CM | POA: Diagnosis not present

## 2017-02-16 DIAGNOSIS — K829 Disease of gallbladder, unspecified: Secondary | ICD-10-CM | POA: Diagnosis not present

## 2017-02-27 ENCOUNTER — Other Ambulatory Visit: Payer: Self-pay | Admitting: Surgery

## 2017-03-08 DIAGNOSIS — K829 Disease of gallbladder, unspecified: Secondary | ICD-10-CM | POA: Diagnosis not present

## 2017-03-08 DIAGNOSIS — D171 Benign lipomatous neoplasm of skin and subcutaneous tissue of trunk: Secondary | ICD-10-CM | POA: Diagnosis not present

## 2017-03-10 ENCOUNTER — Other Ambulatory Visit (HOSPITAL_COMMUNITY): Payer: Self-pay | Admitting: *Deleted

## 2017-03-10 ENCOUNTER — Encounter (HOSPITAL_COMMUNITY)
Admission: RE | Admit: 2017-03-10 | Discharge: 2017-03-10 | Disposition: A | Discharge status: Home / Self Care | Payer: BLUE CROSS/BLUE SHIELD | Source: Ambulatory Visit | Attending: Surgery | Admitting: Surgery

## 2017-03-10 ENCOUNTER — Encounter (HOSPITAL_COMMUNITY): Payer: Self-pay

## 2017-03-10 DIAGNOSIS — Z01810 Encounter for preprocedural cardiovascular examination: Secondary | ICD-10-CM | POA: Diagnosis not present

## 2017-03-10 DIAGNOSIS — Z01812 Encounter for preprocedural laboratory examination: Secondary | ICD-10-CM | POA: Insufficient documentation

## 2017-03-10 HISTORY — DX: Constipation, unspecified: K59.00

## 2017-03-10 HISTORY — DX: Malignant (primary) neoplasm, unspecified: C80.1

## 2017-03-10 HISTORY — DX: Personal history of urinary calculi: Z87.442

## 2017-03-10 HISTORY — DX: Nontoxic single thyroid nodule: E04.1

## 2017-03-10 HISTORY — DX: Anxiety disorder, unspecified: F41.9

## 2017-03-10 HISTORY — DX: Hyperlipidemia, unspecified: E78.5

## 2017-03-10 HISTORY — DX: Gastro-esophageal reflux disease without esophagitis: K21.9

## 2017-03-10 HISTORY — DX: Sleep apnea, unspecified: G47.30

## 2017-03-10 HISTORY — DX: Unspecified osteoarthritis, unspecified site: M19.90

## 2017-03-10 LAB — COMPREHENSIVE METABOLIC PANEL
ALBUMIN: 4.3 g/dL (ref 3.5–5.0)
ALT: 26 U/L (ref 14–54)
AST: 27 U/L (ref 15–41)
Alkaline Phosphatase: 97 U/L (ref 38–126)
Anion gap: 11 (ref 5–15)
BILIRUBIN TOTAL: 0.6 mg/dL (ref 0.3–1.2)
BUN: 19 mg/dL (ref 6–20)
CO2: 24 mmol/L (ref 22–32)
Calcium: 9.8 mg/dL (ref 8.9–10.3)
Chloride: 104 mmol/L (ref 101–111)
Creatinine, Ser: 0.67 mg/dL (ref 0.44–1.00)
GFR calc Af Amer: >60 mL/min (ref >60)
GFR calc non Af Amer: >60 mL/min (ref >60)
GLUCOSE: 99 mg/dL (ref 65–99)
Potassium: 3.8 mmol/L (ref 3.5–5.1)
Sodium: 139 mmol/L (ref 135–145)
Total Protein: 7.4 g/dL (ref 6.5–8.1)

## 2017-03-10 LAB — CBC
HEMATOCRIT: 40.4 % (ref 36.0–46.0)
Hemoglobin: 13.7 g/dL (ref 12.0–15.0)
MCH: 29.1 pg (ref 26.0–34.0)
MCHC: 33.9 g/dL (ref 30.0–36.0)
MCV: 86 fL (ref 78.0–100.0)
Platelets: 260 10*3/uL (ref 150–400)
RBC: 4.7 MIL/uL (ref 3.87–5.11)
RDW: 12.6 % (ref 11.5–15.5)
WBC: 8.2 10*3/uL (ref 4.0–10.5)

## 2017-03-10 NOTE — Pre-Procedure Instructions (Signed)
Julia Sloan  03/10/2017      [facility], [place] [address] Phone: [phone] Fax: [phone]    Your procedure is scheduled on 03-16-2017  Thursday .  Report to [facility] at 7:30 A.M   Call this number if you have problems the morning of surgery:  [phone]   Remember:  Do not eat food or drink liquids after midnight.   Take these medicines the morning of surgery with A SIP OF WATER  None    STOP ASPIRIN,ANTIINFLAMATORIES (IBUPROFEN,ALEVE,MOTRIN,ADVIL,GOODY'S POWDERS),HERBAL SUPPLEMENTS,FISH OIL,AND VITAMINS 5-7 DAYS PRIOR TO SURGERY   Do not wear jewelry, make-up or nail polish.  Do not wear lotions, powders, or perfumes, or deoderant.  Do not shave 48 hours prior to surgery.  Men may shave face and neck.   Do not bring valuables to the hospital.  [facility] is not responsible for any belongings or valuables.  Contacts, dentures or bridgework may not be worn into surgery.  Leave your suitcase in the car.  After surgery it may be brought to your room.  For patients admitted to the hospital, discharge time will be determined by your treatment team.  Patients discharged the day of surgery will not be allowed to drive home.    Special Instructions: [facility] - Preparing for Surgery  Before surgery, you can play an important role.  Because skin is not sterile, your skin needs to be as free of germs as possible.  You can reduce the number of germs on you skin by washing with CHG (chlorahexidine gluconate) soap before surgery.  CHG is an antiseptic cleaner which kills germs and bonds with the skin to continue killing germs even after washing.  Please DO NOT use if you have an allergy to CHG or antibacterial soaps.  If your skin becomes reddened/irritated stop using the CHG and inform your nurse when you arrive at Short Stay.  Do not shave (including  legs and underarms) for at least 48 hours prior to the first CHG shower.  You may shave your face.  Please follow these instructions carefully:   1.  Shower with CHG Soap the night before surgery and the   morning of Surgery.  2.  If you choose to wash your hair, wash your hair first as usual with your normal shampoo.  3.  After you shampoo, rinse your hair and body thoroughly to remove the  Shampoo.  4.  Use CHG as you would any other liquid soap.  You can apply chg directly  to the skin and wash gently with scrungie or a clean washcloth.  5.  Apply the CHG Soap to your body ONLY FROM THE NECK DOWN.   Do not use on open wounds or open sores.  Avoid contact with your eyes,  ears, mouth and genitals (private parts).  Wash genitals (private parts) with your normal soap.  6.  Wash thoroughly, paying special attention to the area where your surgery will be performed.  7.  Thoroughly rinse your body with warm water from the neck down.  8.  DO NOT shower/wash with your normal soap after using and rinsing o  the CHG Soap.  9.  Pat yourself dry with a clean towel.            10.  Wear clean pajamas.            11.  Place clean sheets on your bed the night of your first shower and do not sleep with pets.  Day of Surgery  Do not apply any lotions/deodorants the morning of surgery.  Please wear clean clothes to the hospital/surgery center.   Please read over the following fact sheets that you were given. Pain Booklet, Coughing and Deep Breathing and Surgical Site Infection Prevention

## 2017-03-10 NOTE — Progress Notes (Addendum)
PCP  Dr, Deland Pretty   No cardiologist , Stress test many years ago normal

## 2017-03-15 NOTE — H&P (Signed)
Julia Sloan  Location: [facility] Patient #: [id number] DOB: August 04, 1956 Married / Language: English / Race: White Female  History of Present Illness   The patient is a 61 year old female who presents with a complaint of gall bladder disease.   The PCP is Dr. Audie Sloan  The patient was referred by Dr. Audie Sloan   She comes by herself.  She comes back for evaluation of a lipoma, that she forgot to show me when I saw her last time. She has noticed a lump, for some time. It has become larger and causing some discomfort. She is interested in having this removed at the same time as her cholecystectomy. I discussed the indications and risk of the lipoma excision.   HIstory of gall bladder disease (01/2017): Over the last 3 weeks, Ms. Julia Sloan has noticed some discomfort in her right upper quadrant. It is not always related to food. It sometimes bothers her when she bends over. She feels that she is being poked by a stick. She has no family history of gallbladder disease. She has has some friends who had surgery for gallbladder disease. She had an ultrasound of her abdomen on 06 February 2017 which shows multiple gallstones. I gave her copies of this report. Then when he started thinking about this, she said that she is has some pain and her symptoms going on over 2 years. She's had no nausea or vomiting, no fever, no change in bowel habits. She's never had a colonoscopy. She knows she ought to get one. She had a traumatic hemorrhoidectomy after last pregnancy and this is affecting her inclination to have a colonoscopy.  I discussed with the patient the indications and risks of gall bladder surgery. The primary risks of gall bladder surgery include, but are not limited to, bleeding, infection, common bile duct injury, and open surgery. There is also the risk that the patient may have continued symptoms after surgery. We discussed  the typical post-operative recovery course. I tried to answer the patient's questions. I gave the patient literature about gall bladder surgery.  Plan: 1) She has decided to porceed with gall bladder surgery. She is on for 03/16/2017. She is anxious. I answered a lot questions. 2) I examined the lipoma and went over its excision. 3) She asked about post op pain meds - she does not want narcotics. We talked about tramadol some, but she is not enthused about this either.  Past Medical History: 1. HTN x 10 years 2. Hypercholesterolemia 3. History of kidney stones She required a stone extraction by dr. Gaynelle Sloan 4. Hemorrhoidectomy after last pregnancy (around 1994) that was traumatic - this is part of what has made her avoid a colonoscopy. She is worried about an impaction and takes Citrucel daily. 5. Her last mammogram was 3 years ago. She knows that she needs to update this. 6. 2 C sections 7. GERD - especially when she eats.  Social History: Married. Husband, Julia Sloan. 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 Losartan Potassium-HCTZ (50-12.5MG  Tablet, Oral) Active. Calcium Carbonate (1250 (500 Ca)MG Tablet, Oral) Active. Cholecalciferol (2000UNIT Tablet, Oral) Active. Medications Reconciled  Vitals Malachy Moan RMA; 03/08/2017 12:27 PM)  03/08/2017 12:27 PM Height: 61.5in Temp.: 98.66F  Pulse: 85 (Regular)  BP: 116/70 (Sitting, Left Arm, Standard)   Physical Exam  General: WN mildy obese WF alert and generally healthy appearing. HEENT: Normal.  Pupils equal.  Neck: Supple. No mass. No thyroid mass.  Lymph Nodes: No supraclavicular or cervical nodes.  Lungs: Clear to auscultation and symmetric breath sounds. Heart: RRR. No murmur or rub.  Abdomen: Soft. No mass. No tenderness. No hernia. Normal bowel sounds.  Pfannenstiel incision. She points to her RUQ as where she has this vague discomfort. It is sometimes positional.  Back: Right back lipoma - 5 x 6 cm - mobile  Extremities: Good strength and ROM in upper and lower extremities.  Neurologic: Grossly intact to motor and sensory function. Psychiatric: Has normal mood and affect. Behavior is normal.   Assessment & Plan  1.  GALL BLADDER DISEASE (K82.9)  Impression: Gallstones on [place] on 02/06/2017  Plan:  1) She has decided to go ahead with gall bladder surgery. She is scheduled for 03/16/2017.  2.  LIPOMA OF BACK (D17.1)  Impression: 5 x 6 cm  Plan:   Will plan to excise the day of gall bladder surgery.  3. HTN x 10 years 4. Hypercholesterolemia 5. History of kidney stones She required a stone extraction by dr. Gaynelle Sloan 6. Hemorrhoidectomy after last pregnancy (around 1994) that was traumatic - this is part of what has made her avoid a colonoscopy. She is worried about an impaction and takes Citrucel daily. 7. Her last mammogram was 3 years ago. She knows that she needs to update this. 8. 2 C sections 9. GERD - especially when she eats.   Alphonsa Overall, MD, [facility] Pager: [phone] Office phone:  [phone]

## 2017-03-16 ENCOUNTER — Ambulatory Visit (HOSPITAL_COMMUNITY): Payer: BLUE CROSS/BLUE SHIELD | Admitting: Certified Registered Nurse Anesthetist

## 2017-03-16 ENCOUNTER — Encounter (HOSPITAL_COMMUNITY)
Admission: RE | Disposition: A | Discharge status: Home / Self Care | Payer: Self-pay | Source: Ambulatory Visit | Attending: Surgery

## 2017-03-16 ENCOUNTER — Encounter (HOSPITAL_COMMUNITY): Payer: Self-pay | Admitting: *Deleted

## 2017-03-16 ENCOUNTER — Ambulatory Visit (HOSPITAL_COMMUNITY): Payer: BLUE CROSS/BLUE SHIELD

## 2017-03-16 ENCOUNTER — Ambulatory Visit (HOSPITAL_COMMUNITY)
Admission: RE | Admit: 2017-03-16 | Discharge: 2017-03-16 | Disposition: A | Discharge status: Home / Self Care | Payer: BLUE CROSS/BLUE SHIELD | Source: Ambulatory Visit | Attending: Surgery | Admitting: Surgery

## 2017-03-16 DIAGNOSIS — Z881 Allergy status to other antibiotic agents status: Secondary | ICD-10-CM | POA: Insufficient documentation

## 2017-03-16 DIAGNOSIS — E7800 Pure hypercholesterolemia, unspecified: Secondary | ICD-10-CM | POA: Diagnosis not present

## 2017-03-16 DIAGNOSIS — F419 Anxiety disorder, unspecified: Secondary | ICD-10-CM | POA: Insufficient documentation

## 2017-03-16 DIAGNOSIS — K811 Chronic cholecystitis: Secondary | ICD-10-CM | POA: Diagnosis not present

## 2017-03-16 DIAGNOSIS — Z87442 Personal history of urinary calculi: Secondary | ICD-10-CM | POA: Insufficient documentation

## 2017-03-16 DIAGNOSIS — Z888 Allergy status to other drugs, medicaments and biological substances status: Secondary | ICD-10-CM | POA: Diagnosis not present

## 2017-03-16 DIAGNOSIS — G4730 Sleep apnea, unspecified: Secondary | ICD-10-CM | POA: Insufficient documentation

## 2017-03-16 DIAGNOSIS — M1990 Unspecified osteoarthritis, unspecified site: Secondary | ICD-10-CM | POA: Diagnosis not present

## 2017-03-16 DIAGNOSIS — K8010 Calculus of gallbladder with chronic cholecystitis without obstruction: Secondary | ICD-10-CM | POA: Diagnosis present

## 2017-03-16 DIAGNOSIS — I10 Essential (primary) hypertension: Secondary | ICD-10-CM | POA: Diagnosis not present

## 2017-03-16 DIAGNOSIS — Z79899 Other long term (current) drug therapy: Secondary | ICD-10-CM | POA: Diagnosis not present

## 2017-03-16 DIAGNOSIS — K219 Gastro-esophageal reflux disease without esophagitis: Secondary | ICD-10-CM | POA: Insufficient documentation

## 2017-03-16 DIAGNOSIS — K819 Cholecystitis, unspecified: Secondary | ICD-10-CM

## 2017-03-16 DIAGNOSIS — D171 Benign lipomatous neoplasm of skin and subcutaneous tissue of trunk: Secondary | ICD-10-CM | POA: Insufficient documentation

## 2017-03-16 DIAGNOSIS — K8020 Calculus of gallbladder without cholecystitis without obstruction: Secondary | ICD-10-CM | POA: Diagnosis not present

## 2017-03-16 HISTORY — PX: MASS EXCISION: SHX2000

## 2017-03-16 HISTORY — PX: CHOLECYSTECTOMY: SHX55

## 2017-03-16 SURGERY — LAPAROSCOPIC CHOLECYSTECTOMY WITH INTRAOPERATIVE CHOLANGIOGRAM
Anesthesia: General | Site: Back | Laterality: Right

## 2017-03-16 MED ORDER — CEFAZOLIN SODIUM-DEXTROSE 2-4 GM/100ML-% IV SOLN
2.0000 g | INTRAVENOUS | Status: AC
  Administered 2017-03-16: 2 g via INTRAVENOUS
  Filled 2017-03-16: qty 100

## 2017-03-16 MED ORDER — LIDOCAINE HCL (CARDIAC) 20 MG/ML IV SOLN
INTRAVENOUS | Status: DC | PRN
  Administered 2017-03-16: 60 mg via INTRAVENOUS

## 2017-03-16 MED ORDER — PROPOFOL 10 MG/ML IV BOLUS
INTRAVENOUS | Status: AC
  Filled 2017-03-16: qty 20

## 2017-03-16 MED ORDER — BUPIVACAINE HCL (PF) 0.25 % IJ SOLN
INTRAMUSCULAR | Status: AC
  Filled 2017-03-16: qty 30

## 2017-03-16 MED ORDER — PHENYLEPHRINE HCL 10 MG/ML IJ SOLN
INTRAMUSCULAR | Status: DC | PRN
  Administered 2017-03-16: 80 ug via INTRAVENOUS
  Administered 2017-03-16: 40 ug via INTRAVENOUS

## 2017-03-16 MED ORDER — EPHEDRINE SULFATE 50 MG/ML IJ SOLN
INTRAMUSCULAR | Status: DC | PRN
  Administered 2017-03-16: 5 mg via INTRAVENOUS

## 2017-03-16 MED ORDER — EPHEDRINE 5 MG/ML INJ
INTRAVENOUS | Status: AC
  Filled 2017-03-16: qty 10

## 2017-03-16 MED ORDER — 0.9 % SODIUM CHLORIDE (POUR BTL) OPTIME
TOPICAL | Status: DC | PRN
  Administered 2017-03-16: 1000 mL

## 2017-03-16 MED ORDER — ROCURONIUM BROMIDE 100 MG/10ML IV SOLN
INTRAVENOUS | Status: DC | PRN
  Administered 2017-03-16: 40 mg via INTRAVENOUS

## 2017-03-16 MED ORDER — ACETAMINOPHEN 500 MG PO TABS
1000.0000 mg | ORAL_TABLET | ORAL | Status: AC
  Administered 2017-03-16: 1000 mg via ORAL
  Filled 2017-03-16: qty 2

## 2017-03-16 MED ORDER — CHLORHEXIDINE GLUCONATE CLOTH 2 % EX PADS: 6.0000 | MEDICATED_PAD | Freq: Once | CUTANEOUS | Status: DC

## 2017-03-16 MED ORDER — SUGAMMADEX SODIUM 200 MG/2ML IV SOLN
INTRAVENOUS | Status: DC | PRN
  Administered 2017-03-16: 150 mg via INTRAVENOUS

## 2017-03-16 MED ORDER — LACTATED RINGERS IV SOLN
INTRAVENOUS | Status: DC
  Administered 2017-03-16 (×2): via INTRAVENOUS

## 2017-03-16 MED ORDER — GABAPENTIN 300 MG PO CAPS
300.0000 mg | ORAL_CAPSULE | ORAL | Status: AC
  Administered 2017-03-16: 300 mg via ORAL
  Filled 2017-03-16: qty 1

## 2017-03-16 MED ORDER — FENTANYL CITRATE (PF) 100 MCG/2ML IJ SOLN
INTRAMUSCULAR | Status: DC | PRN
  Administered 2017-03-16: 50 ug via INTRAVENOUS
  Administered 2017-03-16: 100 ug via INTRAVENOUS

## 2017-03-16 MED ORDER — HYDROMORPHONE HCL 1 MG/ML IJ SOLN: 0.2500 mg | INTRAMUSCULAR | Status: DC | PRN

## 2017-03-16 MED ORDER — IOPAMIDOL (ISOVUE-300) INJECTION 61%
INTRAVENOUS | Status: AC
  Filled 2017-03-16: qty 50

## 2017-03-16 MED ORDER — MIDAZOLAM HCL 5 MG/5ML IJ SOLN
INTRAMUSCULAR | Status: DC | PRN
  Administered 2017-03-16: 2 mg via INTRAVENOUS

## 2017-03-16 MED ORDER — SODIUM CHLORIDE 0.9 % IR SOLN
Status: DC | PRN
  Administered 2017-03-16: 1000 mL

## 2017-03-16 MED ORDER — DEXAMETHASONE SODIUM PHOSPHATE 10 MG/ML IJ SOLN
INTRAMUSCULAR | Status: DC | PRN
  Administered 2017-03-16: 10 mg via INTRAVENOUS

## 2017-03-16 MED ORDER — SODIUM CHLORIDE 0.9 % IV SOLN
INTRAVENOUS | Status: DC | PRN
  Administered 2017-03-16: 11 mL

## 2017-03-16 MED ORDER — FENTANYL CITRATE (PF) 250 MCG/5ML IJ SOLN
INTRAMUSCULAR | Status: AC
  Filled 2017-03-16: qty 5

## 2017-03-16 MED ORDER — ONDANSETRON HCL 4 MG/2ML IJ SOLN
INTRAMUSCULAR | Status: DC | PRN
  Administered 2017-03-16: 4 mg via INTRAVENOUS

## 2017-03-16 MED ORDER — PROPOFOL 10 MG/ML IV BOLUS
INTRAVENOUS | Status: DC | PRN
  Administered 2017-03-16: 120 mg via INTRAVENOUS

## 2017-03-16 MED ORDER — MIDAZOLAM HCL 2 MG/2ML IJ SOLN
INTRAMUSCULAR | Status: AC
  Filled 2017-03-16: qty 2

## 2017-03-16 MED ORDER — HYDROCODONE-ACETAMINOPHEN 5-325 MG PO TABS: 1.0000 | ORAL_TABLET | Freq: Four times a day (QID) | ORAL | 0 refills | Status: DC | PRN

## 2017-03-16 MED ORDER — DEXAMETHASONE SODIUM PHOSPHATE 10 MG/ML IJ SOLN
INTRAMUSCULAR | Status: AC
  Filled 2017-03-16: qty 1

## 2017-03-16 MED ORDER — BUPIVACAINE HCL (PF) 0.25 % IJ SOLN
INTRAMUSCULAR | Status: DC | PRN
  Administered 2017-03-16: 30 mL

## 2017-03-16 SURGICAL SUPPLY — 51 items
ADH SKN CLS APL DERMABOND .7 (GAUZE/BANDAGES/DRESSINGS) ×4
APPLIER CLIP 5 13 M/L LIGAMAX5 (MISCELLANEOUS) ×3
APPLIER CLIP ROT 10 11.4 M/L (STAPLE) ×3
APR CLP MED LRG 11.4X10 (STAPLE) ×2
APR CLP MED LRG 5 ANG JAW (MISCELLANEOUS) ×2
BAG SPEC RTRVL LRG 6X4 10 (ENDOMECHANICALS) ×2
BLADE CLIPPER SURG (BLADE) IMPLANT
BLADE SURG 15 STRL LF DISP TIS (BLADE) IMPLANT
BLADE SURG 15 STRL SS (BLADE) ×6
CANISTER SUCT 3000ML PPV (MISCELLANEOUS) ×3 IMPLANT
CHLORAPREP W/TINT 26ML (MISCELLANEOUS) ×3 IMPLANT
CHOLANGIOGRAM CATH TAUT (CATHETERS) ×3 IMPLANT
CLIP APPLIE 5 13 M/L LIGAMAX5 (MISCELLANEOUS) IMPLANT
CLIP APPLIE ROT 10 11.4 M/L (STAPLE) IMPLANT
COVER MAYO STAND STRL (DRAPES) ×3 IMPLANT
COVER SURGICAL LIGHT HANDLE (MISCELLANEOUS) ×3 IMPLANT
DERMABOND ADVANCED (GAUZE/BANDAGES/DRESSINGS) ×2
DERMABOND ADVANCED .7 DNX12 (GAUZE/BANDAGES/DRESSINGS) ×2 IMPLANT
DRAPE C-ARM 42X72 X-RAY (DRAPES) ×3 IMPLANT
ELECT REM PT RETURN 9FT ADLT (ELECTROSURGICAL) ×3
ELECTRODE REM PT RTRN 9FT ADLT (ELECTROSURGICAL) ×2 IMPLANT
FILTER SMOKE EVAC LAPAROSHD (FILTER) ×3 IMPLANT
GAUZE SPONGE 4X4 12PLY STRL LF (GAUZE/BANDAGES/DRESSINGS) ×1 IMPLANT
GLOVE SURG SIGNA 7.5 PF LTX (GLOVE) ×3 IMPLANT
GOWN STRL REUS W/ TWL LRG LVL3 (GOWN DISPOSABLE) ×4 IMPLANT
GOWN STRL REUS W/ TWL XL LVL3 (GOWN DISPOSABLE) ×2 IMPLANT
GOWN STRL REUS W/TWL LRG LVL3 (GOWN DISPOSABLE) ×9
GOWN STRL REUS W/TWL XL LVL3 (GOWN DISPOSABLE) ×3
IV CATH 14GX2 1/4 (CATHETERS) ×3 IMPLANT
KIT BASIN OR (CUSTOM PROCEDURE TRAY) ×3 IMPLANT
KIT ROOM TURNOVER OR (KITS) ×3 IMPLANT
NS IRRIG 1000ML POUR BTL (IV SOLUTION) ×3 IMPLANT
PAD ARMBOARD 7.5X6 YLW CONV (MISCELLANEOUS) ×3 IMPLANT
PENCIL BUTTON HOLSTER BLD 10FT (ELECTRODE) ×2 IMPLANT
POUCH SPECIMEN RETRIEVAL 10MM (ENDOMECHANICALS) ×3 IMPLANT
SCISSORS LAP 5X35 DISP (ENDOMECHANICALS) ×3 IMPLANT
SET IRRIG TUBING LAPAROSCOPIC (IRRIGATION / IRRIGATOR) ×3 IMPLANT
SLEEVE ENDOPATH XCEL 5M (ENDOMECHANICALS) ×5 IMPLANT
SPECIMEN JAR SMALL (MISCELLANEOUS) ×3 IMPLANT
STOPCOCK 4 WAY LG BORE MALE ST (IV SETS) ×3 IMPLANT
SUT MNCRL AB 4-0 PS2 18 (SUTURE) ×4 IMPLANT
SUT VIC AB 3-0 SH 18 (SUTURE) ×1 IMPLANT
TAPE CLOTH SURG 6X10 WHT LF (GAUZE/BANDAGES/DRESSINGS) ×1 IMPLANT
TOWEL OR 17X24 6PK STRL BLUE (TOWEL DISPOSABLE) ×3 IMPLANT
TOWEL OR 17X26 10 PK STRL BLUE (TOWEL DISPOSABLE) ×3 IMPLANT
TRAY LAPAROSCOPIC MC (CUSTOM PROCEDURE TRAY) ×3 IMPLANT
TROCAR XCEL BLUNT TIP 100MML (ENDOMECHANICALS) ×3 IMPLANT
TROCAR XCEL NON-BLD 11X100MML (ENDOMECHANICALS) IMPLANT
TROCAR XCEL NON-BLD 5MMX100MML (ENDOMECHANICALS) ×3 IMPLANT
TUBING EXTENTION W/L.L. (IV SETS) ×3 IMPLANT
TUBING INSUFFLATION (TUBING) ×3 IMPLANT

## 2017-03-16 NOTE — Op Note (Signed)
03/16/2017  11:04 AM  PATIENT:  Julia Sloan, 61 y.o., female, MRN: [id number]  PREOP DIAGNOSIS:  Chronic cholecystitis, mass right side (6 x 8 cm)  POSTOP DIAGNOSIS:   Chronic cholecystitis, mass right side (6 x 8 cm)  PROCEDURE:   Procedure(s): LAPAROSCOPIC CHOLECYSTECTOMY WITH INTRAOPERATIVE CHOLANGIOGRAM, EXCISION MASS right back  SURGEON:   Alphonsa Overall, M.D.  ASSISTANT:   None  ANESTHESIA:   general  Anesthesiologist: Roderic Palau, MD CRNA: Shirlyn Goltz, CRNA; Barrington Ellison, CRNA  General  ASA: 2  EBL:  <50  ml  BLOOD ADMINISTERED: none  DRAINS: none   LOCAL MEDICATIONS USED:   30 cc 1/4% marcaine  SPECIMEN:   Gall bladder and right flank mass  COUNTS CORRECT:  YES  INDICATIONS FOR PROCEDURE:  Julia Sloan is a 61 y.o. (DOB: [birth year]) white female whose primary care physician is Horatio Pel, MD and comes for cholecystectomy.  She also has a symptomatic mass on her right lateral back that we will plan to excise.   The indications and risks of the gall bladder surgery were explained to the patient.  The risks include, but are not limited to, infection, bleeding, common bile duct injury and open surgery.  SURGERY:  The patient was taken to OR room #2 at [facility].  The abdomen was prepped with Duraprep (she's allergic to CHG).  The patient was given 2 gm Ancef at the beginning of the operation.   A time out was held and the surgical checklist run.   An infraumbilical incision was made into the abdominal cavity.  A 12 mm Hasson trocar was inserted into the abdominal cavity through the infraumbilical incision and secured with a 0 Vicryl suture.  Three additional trocars were inserted: a 5 mm trocar in the sub-xiphoid location, a 5 mm trocar in the right mid subcostal area, and a 5 mm trocar in the right lateral subcostal area.   The abdomen was explored and the liver, stomach, and bowel that could be seen were unremarkable.   The  gall bladder was fairly normal appearing.   I grasped the gall bladder and rotated it cephalad.  Disssection was carried down to the gall bladder/cystic duct junction and the cystic duct isolated.  A clip was placed on the gall bladder side of the cystic duct.   An intra-operative cholangiogram was shot.   The intra-operative cholangiogram was shot using a cut off Taut catheter placed through a 14 gauge angiocath in the RUQ.  The Taut catheter was inserted in the cut cystic duct and secured with an endoclip.  A cholangiogram was shot with 11 cc of 1/2 strength Isoview.  Using fluoroscopy, the cholangiogram showed the flow of contrast into the common bile duct, up the hepatic radicals, and into the duodenum.  There was no mass or obstruction.  This was a normal intra-operative cholangiogram.   The Taut catheter was removed.  The cystic duct was tripley endoclipped and the cystic artery was identified and clipped.  The gall bladder was bluntly and sharpley dissected from the gall bladder bed.   After the gall bladder was removed from the liver, the gall bladder bed and Triangle of [place] were inspected.  There was no bleeding or bile leak.  The gall bladder was placed in a endocatch bag and delivered through the umbilicus.  The abdomen was irrigated with 300 cc saline.   The trocars were then removed.  I infiltrated 15cc of 1/4% Marcaine into the  incisions.  The umbilical port closed with a 0 Vicryl suture and the skin closed with 4-0 Monocryl.  The skin was painted with DermaBond.     The patient was rolled in a left lateral decubitus position with her right side up.  This exposed the mass on her right back.  The mass was about 6 x 8 cm.  I made a 4 cm incision over the mass and excised the lipoma.  The wound was irrigated.  Closed in layers with 3-0 vicryl and 4-0 monocryl.  The wound was painted with DermaBond and a pressure dressing was placed.   The patient's sponge and needle count were correct.  The  patient was transported to the RR in good condition.  Alphonsa Overall, MD, [facility] Pager: [phone] Office phone:  [phone]

## 2017-03-16 NOTE — Anesthesia Preprocedure Evaluation (Addendum)
Anesthesia Evaluation  Patient identified by MRN, date of birth, ID band Patient awake    Reviewed: Allergy & Precautions, H&P , NPO status , Patient's Chart, lab work & pertinent test results  Airway Mallampati: III  TM Distance: >3 FB Neck ROM: Full    Dental no notable dental hx. (+) Teeth Intact, Dental Advisory Given   Pulmonary neg pulmonary ROS, sleep apnea ,    Pulmonary exam normal breath sounds clear to auscultation       Cardiovascular hypertension,  Rhythm:Regular Rate:Normal     Neuro/Psych Anxiety negative neurological ROS  negative psych ROS   GI/Hepatic Neg liver ROS, GERD  Medicated and Controlled,  Endo/Other  negative endocrine ROS  Renal/GU negative Renal ROS  negative genitourinary   Musculoskeletal  (+) Arthritis , Osteoarthritis,    Abdominal   Peds  Hematology negative hematology ROS (+)   Anesthesia Other Findings   Reproductive/Obstetrics negative OB ROS                            Anesthesia Physical Anesthesia Plan  ASA: II  Anesthesia Plan: General   Post-op Pain Management:    Induction: Intravenous  Airway Management Planned: Oral ETT  Additional Equipment:   Intra-op Plan:   Post-operative Plan: Extubation in OR  Informed Consent: I have reviewed the patients History and Physical, chart, labs and discussed the procedure including the risks, benefits and alternatives for the proposed anesthesia with the patient or authorized representative who has indicated his/her understanding and acceptance.   Dental advisory given  Plan Discussed with: CRNA  Anesthesia Plan Comments:         Anesthesia Quick Evaluation

## 2017-03-16 NOTE — Anesthesia Procedure Notes (Signed)
Procedure Name: Intubation Date/Time: 03/16/2017 9:29 AM Performed by: Shirlyn Goltz Pre-anesthesia Checklist: Patient identified, Emergency Drugs available, Suction available and Patient being monitored Patient Re-evaluated:Patient Re-evaluated prior to inductionOxygen Delivery Method: Circle system utilized Preoxygenation: Pre-oxygenation with 100% oxygen Intubation Type: IV induction Ventilation: Mask ventilation without difficulty Laryngoscope Size: Mac and 3 Grade View: Grade IV Tube type: Oral Tube size: 7.0 mm Number of attempts: 3 Airway Equipment and Method: Stylet and Bougie stylet Placement Confirmation: positive ETCO2 and breath sounds checked- equal and bilateral Secured at: 21 cm Tube secured with: Tape Dental Injury: Teeth and Oropharynx as per pre-operative assessment  Difficulty Due To: Difficult Airway- due to anterior larynx Comments: dlx2 crna grade 4 mac 3 with bougie;  Dl 3rd attempt MD grade 4 mac 3 with bougie, successful intubation

## 2017-03-16 NOTE — Transfer of Care (Signed)
Immediate Anesthesia Transfer of Care Note  Patient: Julia Sloan  Procedure(s) Performed: Procedure(s) with comments: LAPAROSCOPIC CHOLECYSTECTOMY WITH INTRAOPERATIVE CHOLANGIOGRAM, (N/A) - lipoma on back EXCISION MASS (Right)  Patient Location: PACU  Anesthesia Type:General  Level of Consciousness: awake, alert , oriented and patient cooperative  Airway & Oxygen Therapy: Patient Spontanous Breathing  Post-op Assessment: Report given to RN and Post -op Vital signs reviewed and stable  Post vital signs: Reviewed and stable  Last Vitals:  Vitals:   03/16/17 0801 03/16/17 1121  BP: 125/77   Pulse: 83   Resp: 18   Temp: 37 C (P) 36.4 C    Last Pain: There were no vitals filed for this visit.       Complications: No apparent anesthesia complications

## 2017-03-16 NOTE — Anesthesia Postprocedure Evaluation (Signed)
Anesthesia Post Note  Patient: Julia Sloan  Procedure(s) Performed: Procedure(s) (LRB): LAPAROSCOPIC CHOLECYSTECTOMY WITH INTRAOPERATIVE CHOLANGIOGRAM, (N/A) EXCISION MASS (Right)  Patient location during evaluation: PACU Anesthesia Type: General Level of consciousness: awake and alert Pain management: pain level controlled Vital Signs Assessment: post-procedure vital signs reviewed and stable Respiratory status: spontaneous breathing, nonlabored ventilation and respiratory function stable Cardiovascular status: blood pressure returned to baseline and stable Postop Assessment: no signs of nausea or vomiting Anesthetic complications: no       Last Vitals:  Vitals:   03/16/17 1206 03/16/17 1220  BP:  (!) 141/89  Pulse: 70 63  Resp: 16   Temp:  36.7 C    Last Pain:  Vitals:   03/16/17 1220  PainSc: 2                  Elon Eoff,W. EDMOND

## 2017-03-16 NOTE — Discharge Instructions (Signed)
[facility] - DISCHARGE INSTRUCTIONS TO PATIENT  Activity:  Driving - May drive in 2 to 4 days, if doing well and off pain meds   Lifting - No lifting more than 15 pounds for 10 days, then no limits  Wound Care:   Leave dressing on for 2 days.  Then you may removed the dressing and shower.         May place ice pack over incisions.  Diet:  As tolerated.  Follow up appointment:  Call Dr. Pollie Friar office [facility]) at [phone] for an appointment in 2 - 3 weeks.  Medications and dosages:  Resume your home medications.  You have a prescription for:  Vicodin  Call Dr. Lucia Gaskins or his office  [phone]) if you have:  Temperature greater than 100.4,  Persistent nausea and vomiting,  Severe uncontrolled pain,  Redness, tenderness, or signs of infection (pain, swelling, redness, odor or green/yellow discharge around the site),  Difficulty breathing, headache or visual disturbances,  Any other questions or concerns you may have after discharge.  In an emergency, call 911 or go to an Emergency Department at a nearby hospital.

## 2017-03-16 NOTE — Interval H&P Note (Signed)
History and Physical Interval Note:  03/16/2017 8:47 AM  Julia Sloan  has presented today for surgery, with the diagnosis of Chronic cholecystitis, mass right side  The various methods of treatment have been discussed with the patient and family.  Husband, Julia Sloan, at bedside.  After consideration of risks, benefits and other options for treatment, the patient has consented to  Procedure(s): LAPAROSCOPIC CHOLECYSTECTOMY WITH INTRAOPERATIVE CHOLANGIOGRAM, EXCISION MASS OF RIGHT SIDE (N/A) as a surgical intervention .  The patient's history has been reviewed, patient examined, no change in status, stable for surgery.  I have reviewed the patient's chart and labs.  Questions were answered to the patient's satisfaction.     Julia Sloan H

## 2017-03-17 ENCOUNTER — Encounter (HOSPITAL_COMMUNITY): Payer: Self-pay | Admitting: Surgery

## 2017-03-28 ENCOUNTER — Other Ambulatory Visit (HOSPITAL_COMMUNITY): Payer: BLUE CROSS/BLUE SHIELD

## 2017-06-09 DIAGNOSIS — M542 Cervicalgia: Secondary | ICD-10-CM | POA: Diagnosis not present

## 2017-06-09 DIAGNOSIS — M546 Pain in thoracic spine: Secondary | ICD-10-CM | POA: Diagnosis not present

## 2017-06-09 DIAGNOSIS — M25512 Pain in left shoulder: Secondary | ICD-10-CM | POA: Diagnosis not present

## 2017-06-13 ENCOUNTER — Other Ambulatory Visit: Payer: Self-pay | Admitting: Ophthalmology

## 2017-06-13 DIAGNOSIS — M25512 Pain in left shoulder: Secondary | ICD-10-CM

## 2017-06-21 ENCOUNTER — Ambulatory Visit (INDEPENDENT_AMBULATORY_CARE_PROVIDER_SITE_OTHER): Payer: Self-pay | Admitting: Orthopedic Surgery

## 2017-06-29 DIAGNOSIS — M542 Cervicalgia: Secondary | ICD-10-CM | POA: Diagnosis not present

## 2017-06-29 DIAGNOSIS — M7542 Impingement syndrome of left shoulder: Secondary | ICD-10-CM | POA: Diagnosis not present

## 2017-06-29 DIAGNOSIS — M25512 Pain in left shoulder: Secondary | ICD-10-CM | POA: Diagnosis not present

## 2017-08-02 DIAGNOSIS — M542 Cervicalgia: Secondary | ICD-10-CM | POA: Diagnosis not present

## 2017-08-02 DIAGNOSIS — M7542 Impingement syndrome of left shoulder: Secondary | ICD-10-CM | POA: Diagnosis not present

## 2017-08-02 DIAGNOSIS — M25512 Pain in left shoulder: Secondary | ICD-10-CM | POA: Diagnosis not present

## 2017-08-10 DIAGNOSIS — M7542 Impingement syndrome of left shoulder: Secondary | ICD-10-CM | POA: Diagnosis not present

## 2017-08-10 DIAGNOSIS — M542 Cervicalgia: Secondary | ICD-10-CM | POA: Diagnosis not present

## 2017-08-22 DIAGNOSIS — M542 Cervicalgia: Secondary | ICD-10-CM | POA: Diagnosis not present

## 2017-08-22 DIAGNOSIS — S46012A Strain of muscle(s) and tendon(s) of the rotator cuff of left shoulder, initial encounter: Secondary | ICD-10-CM | POA: Diagnosis not present

## 2017-08-22 DIAGNOSIS — M25512 Pain in left shoulder: Secondary | ICD-10-CM | POA: Diagnosis not present

## 2017-08-22 DIAGNOSIS — M7542 Impingement syndrome of left shoulder: Secondary | ICD-10-CM | POA: Diagnosis not present

## 2017-09-11 ENCOUNTER — Other Ambulatory Visit: Payer: Self-pay | Admitting: Endocrinology

## 2017-09-11 DIAGNOSIS — E049 Nontoxic goiter, unspecified: Secondary | ICD-10-CM

## 2017-09-26 ENCOUNTER — Ambulatory Visit
Admission: RE | Admit: 2017-09-26 | Discharge: 2017-09-26 | Disposition: A | Discharge status: Home / Self Care | Payer: BLUE CROSS/BLUE SHIELD | Source: Ambulatory Visit | Attending: Endocrinology | Admitting: Endocrinology

## 2017-09-26 DIAGNOSIS — E042 Nontoxic multinodular goiter: Secondary | ICD-10-CM | POA: Diagnosis not present

## 2017-09-26 DIAGNOSIS — E049 Nontoxic goiter, unspecified: Secondary | ICD-10-CM

## 2017-09-28 ENCOUNTER — Other Ambulatory Visit: Payer: BLUE CROSS/BLUE SHIELD

## 2017-09-29 DIAGNOSIS — E049 Nontoxic goiter, unspecified: Secondary | ICD-10-CM | POA: Diagnosis not present

## 2017-09-29 DIAGNOSIS — R7301 Impaired fasting glucose: Secondary | ICD-10-CM | POA: Diagnosis not present

## 2017-09-29 DIAGNOSIS — I10 Essential (primary) hypertension: Secondary | ICD-10-CM | POA: Diagnosis not present

## 2017-10-05 DIAGNOSIS — R7301 Impaired fasting glucose: Secondary | ICD-10-CM | POA: Diagnosis not present

## 2017-10-05 DIAGNOSIS — I10 Essential (primary) hypertension: Secondary | ICD-10-CM | POA: Diagnosis not present

## 2017-10-05 DIAGNOSIS — E049 Nontoxic goiter, unspecified: Secondary | ICD-10-CM | POA: Diagnosis not present

## 2017-10-31 DIAGNOSIS — H5213 Myopia, bilateral: Secondary | ICD-10-CM | POA: Diagnosis not present

## 2017-10-31 DIAGNOSIS — H52201 Unspecified astigmatism, right eye: Secondary | ICD-10-CM | POA: Diagnosis not present

## 2017-10-31 DIAGNOSIS — H524 Presbyopia: Secondary | ICD-10-CM | POA: Diagnosis not present

## 2018-01-18 IMAGING — US US THYROID
1 series · 13 of 25 positions shown · non-contrast
Comparison: 09/15/2015 and previous back to 07/22/2011

CLINICAL DATA: Goiter. Previous FNA biopsy of mid right nodule
02/21/2008.

EXAM:
THYROID ULTRASOUND
TECHNIQUE: Ultrasound examination of the thyroid gland and adjacent soft
tissues was performed.

[Series 1: us thyroid · 0.04mm/px · 13 of 48 slices shown]
[im 1/48]
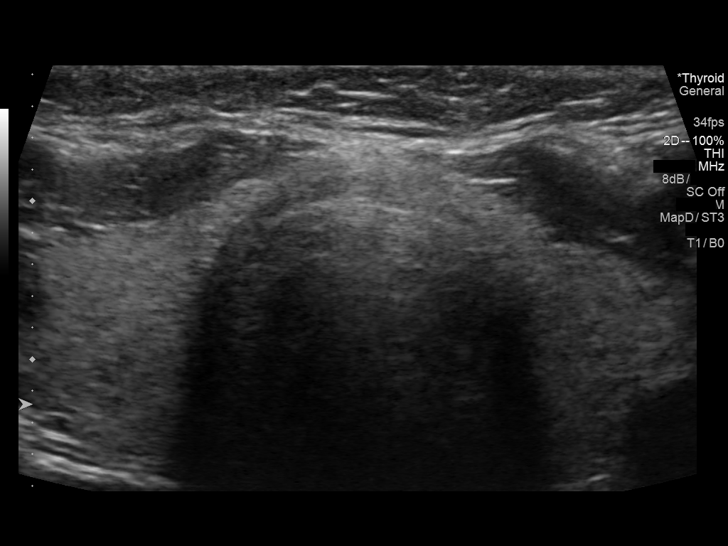
[im 4/48]
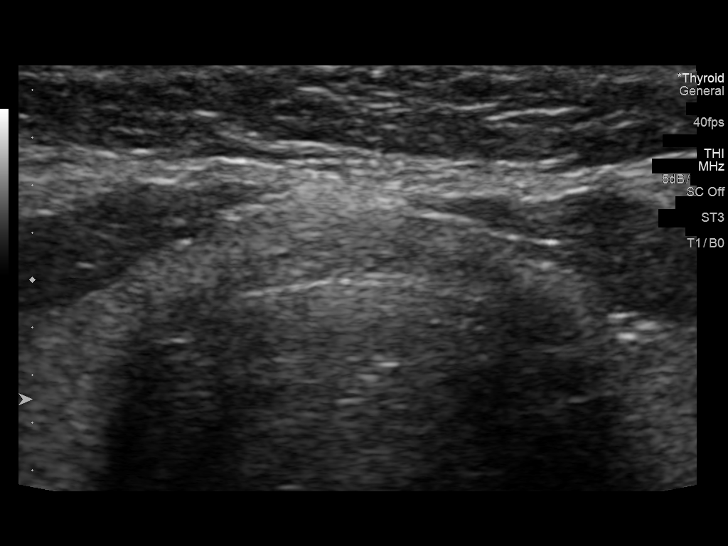
[im 8/48]
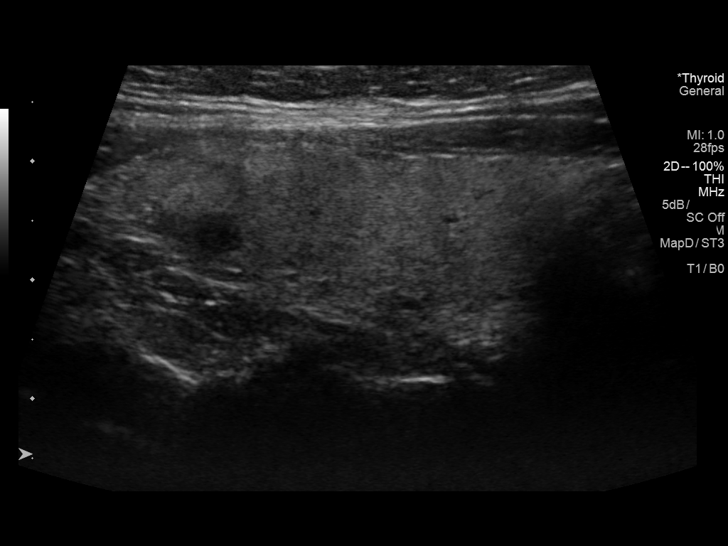
[im 12/48]
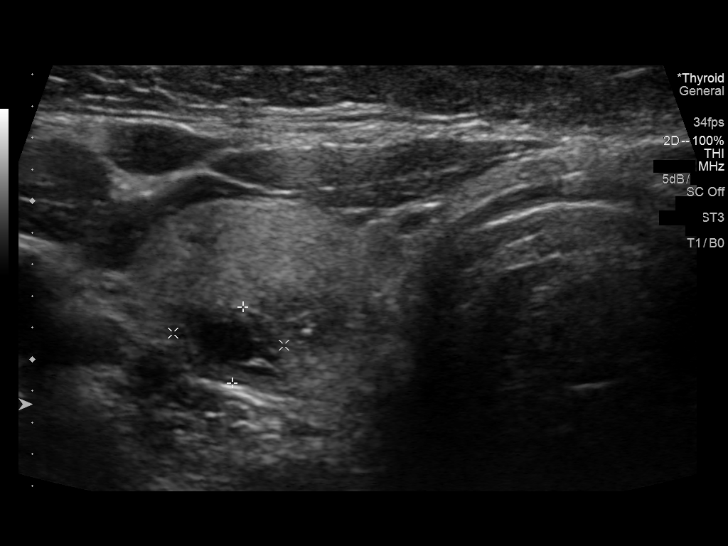
[im 16/48]
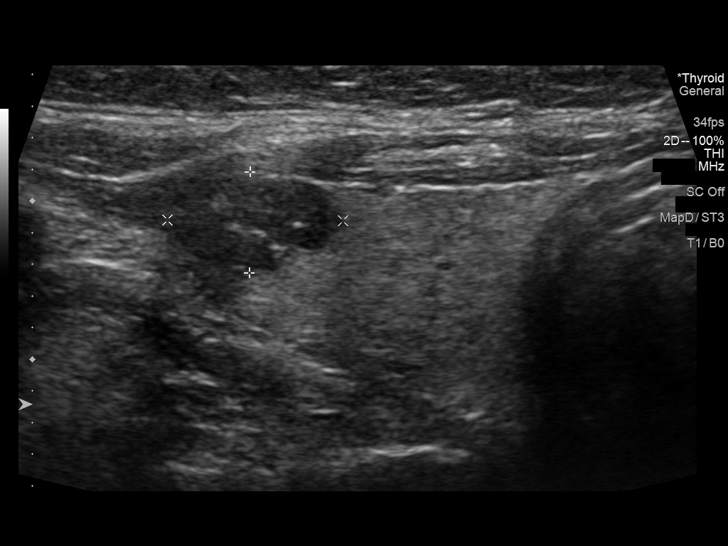
[im 20/48]
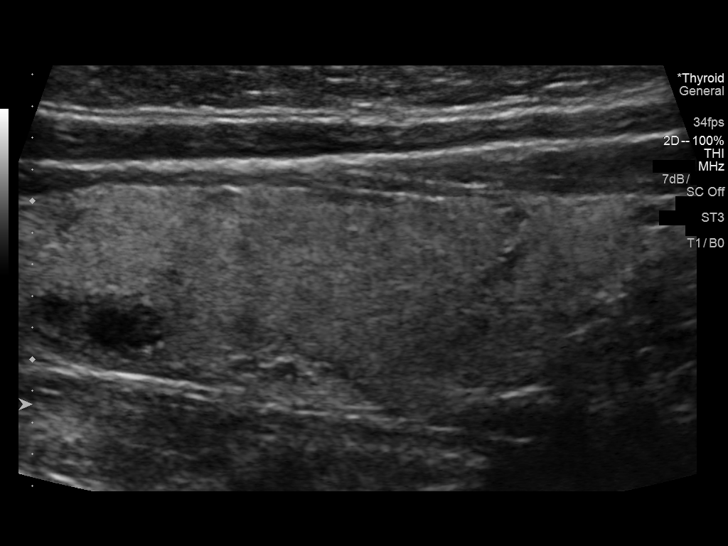
[im 24/48]
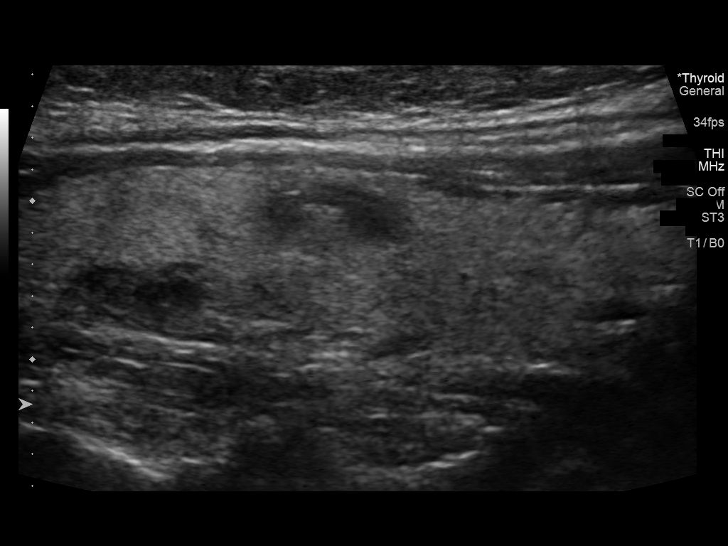
[im 28/48]
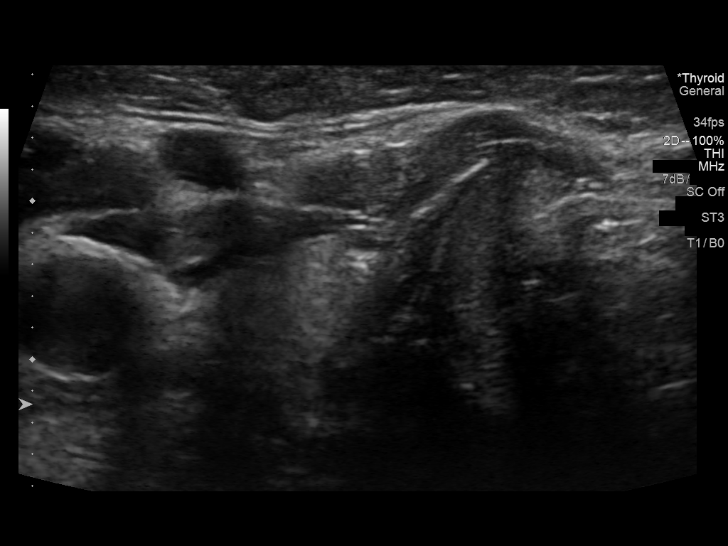
[im 32/48]
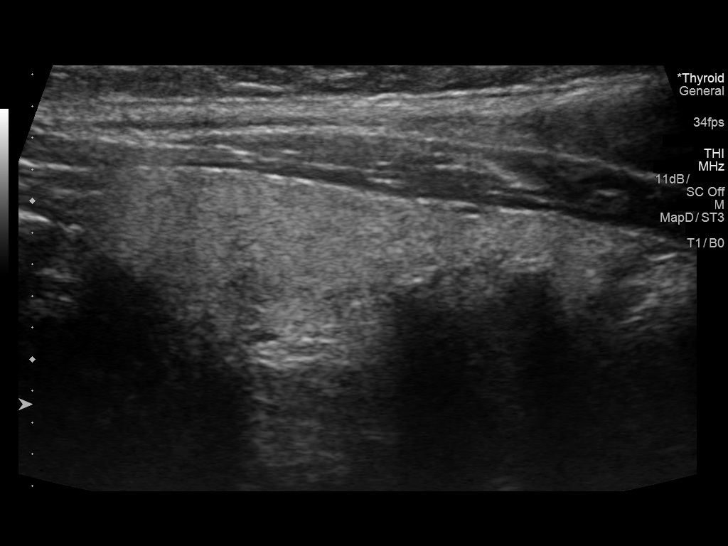
[im 36/48]
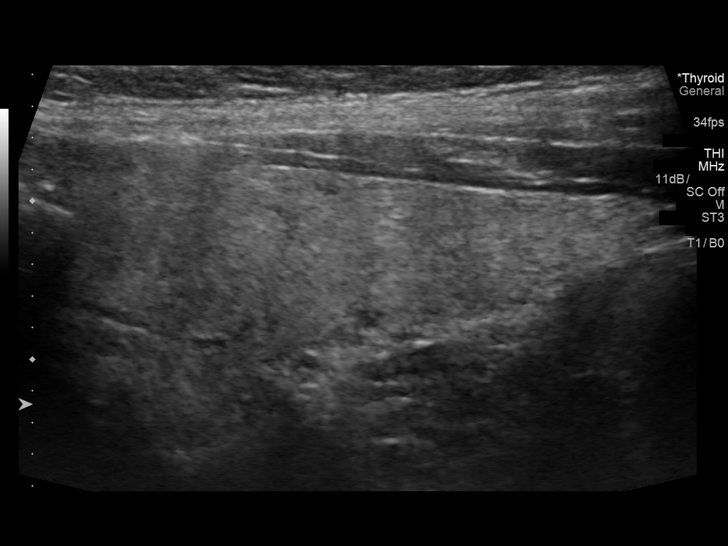
[im 40/48]
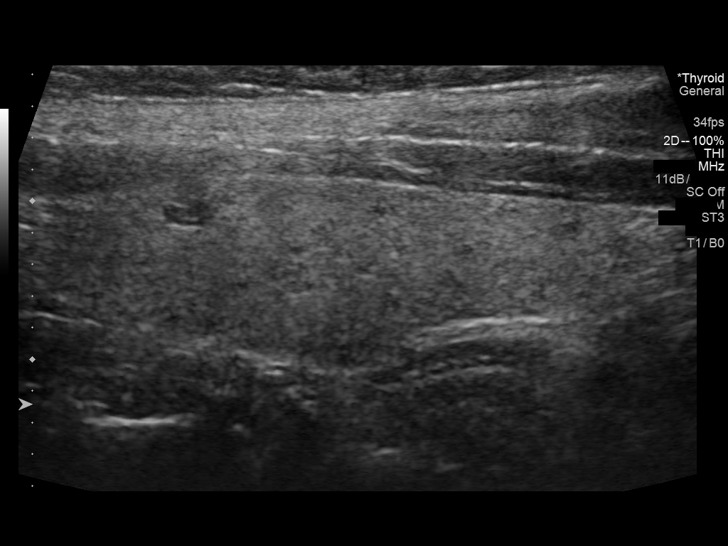
[im 44/48]
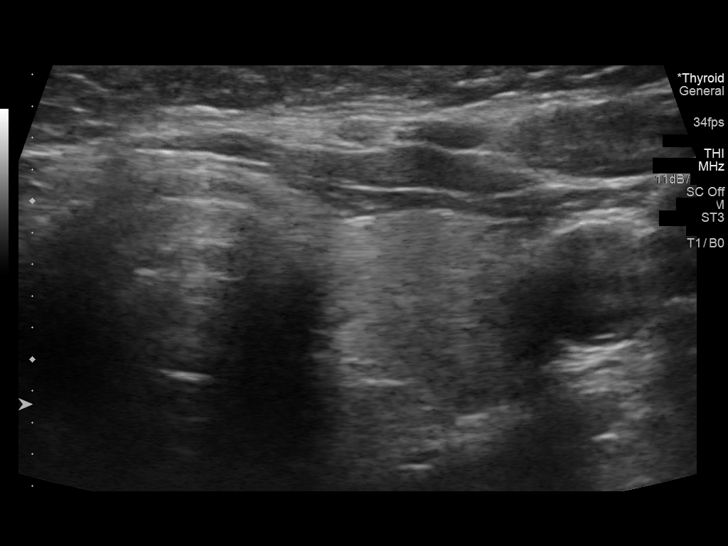
[im 48/48]
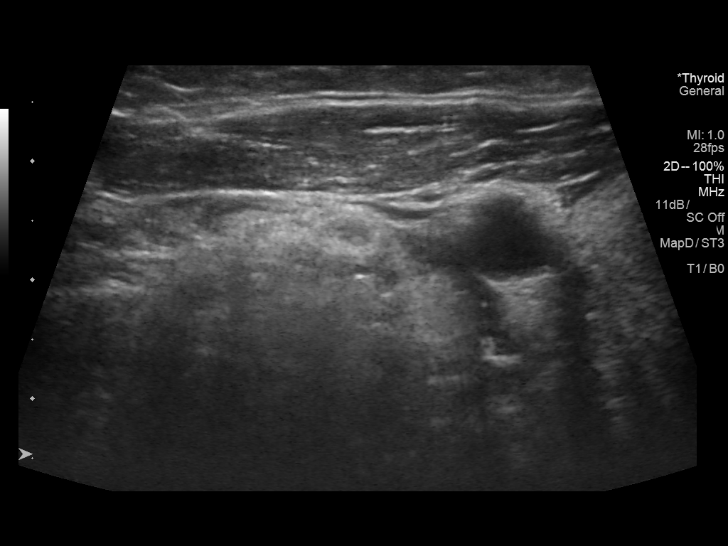

[13 of 25 positions shown; findings below may reference images not displayed]

FINDINGS: Parenchymal Echotexture: Mildly heterogenous

Isthmus: 0.2 cm thickness, stable

Right lobe: 4.6 x 1.5 x 1.4 cm, previously 4.8 x 1.4 x

Left lobe: 4.2 x 1.4 x 1.4 cm, previously 4.3 x 1.1 x

_________________________________________________________

Estimated total number of nodules >/= 1 cm: 1

Number of spongiform nodules >/=  2 cm not described below (TR1): 0

Number of mixed cystic and solid nodules >/= 1.5 cm not described
below (TR2): 0

_________________________________________________________

Nodule # 1:

Prior biopsy: No

Location: Right; Mid

Maximum size: 1.7 cm; Other 2 dimensions: 0.6 x 1.1 cm, previously,
1.7 x 0.9 x 1.3 cm on the most recent prior study, 2.9 x 1.3 x
cm on 07/22/2011

Composition: solid/almost completely solid (2)

Echogenicity: hypoechoic (2)

Shape: not taller-than-wide (0)

Margins: ill-defined (0)

Echogenic foci: punctate echogenic foci (3)

ACR TI-RADS total points: 7.

ACR TI-RADS risk category:  TR5 (>/= 7 points).

Significant change in size (>/= 20% in two dimensions and minimal
increase of 2 mm): No

Change in features: No

Change in ACR TI-RADS risk category: No

ACR TI-RADS recommendations:

This lesion was previously biopsied. Stability over greater than 5
years implies benignity.

_________________________________________________________

0.9 cm mixed solid/ cystic nodule without calcifications, superior
right

0.7 cm spongiform nodule, superior right, previously 0.6.

0.6 cm hypoechoic nodule without calcifications, inferior right,
previously 0.7.
IMPRESSION: 1. Normal-sized thyroid with stable right nodules.
None  meets criteria for biopsy or dedicated imaging follow-up.
The above is in keeping with the ACR TI-RADS recommendations - [HOSPITAL] 2703;[DATE].

## 2018-11-26 DIAGNOSIS — R140 Abdominal distension (gaseous): Secondary | ICD-10-CM | POA: Diagnosis not present

## 2018-11-26 DIAGNOSIS — R1031 Right lower quadrant pain: Secondary | ICD-10-CM | POA: Diagnosis not present

## 2018-11-26 DIAGNOSIS — Z124 Encounter for screening for malignant neoplasm of cervix: Secondary | ICD-10-CM | POA: Diagnosis not present

## 2018-11-26 DIAGNOSIS — Z01419 Encounter for gynecological examination (general) (routine) without abnormal findings: Secondary | ICD-10-CM | POA: Diagnosis not present

## 2018-11-26 DIAGNOSIS — Z6828 Body mass index (BMI) 28.0-28.9, adult: Secondary | ICD-10-CM | POA: Diagnosis not present

## 2018-12-14 DIAGNOSIS — R1031 Right lower quadrant pain: Secondary | ICD-10-CM | POA: Diagnosis not present

## 2018-12-14 DIAGNOSIS — R7303 Prediabetes: Secondary | ICD-10-CM | POA: Diagnosis not present

## 2019-01-15 ENCOUNTER — Encounter: Payer: Self-pay | Admitting: Gastroenterology

## 2019-02-11 ENCOUNTER — Ambulatory Visit: Payer: BLUE CROSS/BLUE SHIELD | Admitting: Gastroenterology

## 2019-02-22 DIAGNOSIS — I10 Essential (primary) hypertension: Secondary | ICD-10-CM | POA: Diagnosis not present

## 2019-03-14 DIAGNOSIS — I10 Essential (primary) hypertension: Secondary | ICD-10-CM | POA: Diagnosis not present

## 2019-03-26 ENCOUNTER — Ambulatory Visit: Payer: BLUE CROSS/BLUE SHIELD | Admitting: Gastroenterology

## 2019-07-26 DIAGNOSIS — R7301 Impaired fasting glucose: Secondary | ICD-10-CM | POA: Diagnosis not present

## 2019-07-26 DIAGNOSIS — Z0000 Encounter for general adult medical examination without abnormal findings: Secondary | ICD-10-CM | POA: Diagnosis not present

## 2019-07-26 DIAGNOSIS — E559 Vitamin D deficiency, unspecified: Secondary | ICD-10-CM | POA: Diagnosis not present

## 2019-07-26 DIAGNOSIS — Z1159 Encounter for screening for other viral diseases: Secondary | ICD-10-CM | POA: Diagnosis not present

## 2019-08-01 DIAGNOSIS — M8589 Other specified disorders of bone density and structure, multiple sites: Secondary | ICD-10-CM | POA: Diagnosis not present

## 2019-08-01 DIAGNOSIS — R05 Cough: Secondary | ICD-10-CM | POA: Diagnosis not present

## 2019-08-01 DIAGNOSIS — E049 Nontoxic goiter, unspecified: Secondary | ICD-10-CM | POA: Diagnosis not present

## 2019-08-01 DIAGNOSIS — I10 Essential (primary) hypertension: Secondary | ICD-10-CM | POA: Diagnosis not present

## 2019-08-01 DIAGNOSIS — Z0000 Encounter for general adult medical examination without abnormal findings: Secondary | ICD-10-CM | POA: Diagnosis not present

## 2019-08-01 DIAGNOSIS — M899 Disorder of bone, unspecified: Secondary | ICD-10-CM | POA: Diagnosis not present

## 2019-10-07 DIAGNOSIS — R300 Dysuria: Secondary | ICD-10-CM | POA: Diagnosis not present

## 2019-10-11 ENCOUNTER — Other Ambulatory Visit: Payer: Self-pay | Admitting: Urology

## 2019-10-11 DIAGNOSIS — R3915 Urgency of urination: Secondary | ICD-10-CM | POA: Diagnosis not present

## 2019-10-11 DIAGNOSIS — N200 Calculus of kidney: Secondary | ICD-10-CM

## 2019-10-11 DIAGNOSIS — R350 Frequency of micturition: Secondary | ICD-10-CM | POA: Diagnosis not present

## 2019-10-17 ENCOUNTER — Ambulatory Visit
Admission: RE | Admit: 2019-10-17 | Discharge: 2019-10-17 | Disposition: A | Discharge status: Home / Self Care | Payer: BC Managed Care – PPO | Source: Ambulatory Visit | Attending: Urology | Admitting: Urology

## 2019-10-17 DIAGNOSIS — R350 Frequency of micturition: Secondary | ICD-10-CM

## 2019-10-17 DIAGNOSIS — N200 Calculus of kidney: Secondary | ICD-10-CM

## 2019-10-17 DIAGNOSIS — Z87442 Personal history of urinary calculi: Secondary | ICD-10-CM | POA: Diagnosis not present

## 2019-11-08 ENCOUNTER — Other Ambulatory Visit: Payer: Self-pay | Admitting: Internal Medicine

## 2019-11-08 DIAGNOSIS — Z1231 Encounter for screening mammogram for malignant neoplasm of breast: Secondary | ICD-10-CM

## 2019-11-26 DIAGNOSIS — R350 Frequency of micturition: Secondary | ICD-10-CM | POA: Diagnosis not present

## 2019-11-26 DIAGNOSIS — N200 Calculus of kidney: Secondary | ICD-10-CM | POA: Diagnosis not present

## 2021-03-25 LAB — COLOGUARD: Cologuard: POSITIVE — AB

## 2021-04-07 ENCOUNTER — Encounter: Payer: Self-pay | Admitting: Gastroenterology

## 2021-05-04 ENCOUNTER — Encounter: Payer: Self-pay | Admitting: Gastroenterology

## 2021-05-04 ENCOUNTER — Ambulatory Visit (INDEPENDENT_AMBULATORY_CARE_PROVIDER_SITE_OTHER): Payer: Medicare Other | Admitting: Gastroenterology

## 2021-05-04 VITALS — BP 110/64 | HR 73 | Ht 61.25 in | Wt 152.0 lb

## 2021-05-04 DIAGNOSIS — R195 Other fecal abnormalities: Secondary | ICD-10-CM

## 2021-05-04 MED ORDER — PLENVU 140 G PO SOLR: 140.0000 g | ORAL | 0 refills | Status: DC

## 2021-05-04 NOTE — Progress Notes (Signed)
[facility] Consult Note:  History: Julia Sloan 05/04/2021  Referring provider: Deland Pretty, MD  Reason for consult/chief complaint: Positive cologard (Never had a colon, patient has not seen any blood, occ pain where her gall bladder was)   Subjective  HPI:  This is a very pleasant 65 year old woman referred by primary care for positive Cologuard test.  She had not had any prior colon cancer screening done, and had been reluctant to undergo colonoscopy in the past because she had a bad experience with constipation and fecal impaction decades ago late in her pregnancy. She has an occasional "twinge" of right upper quadrant pain unrelated to meals position or time of day.  She denies nausea vomiting early satiety or weight loss.  [name] has stayed on Citrucel fiber daily for many years and that maintains regularity.  Sometimes it may cause bloating and gas.  She has noticed sometimes stools are less formed or in pieces, and she denies rectal bleeding.   ROS:  Review of Systems  Constitutional: Negative for appetite change and unexpected weight change.  HENT: Negative for mouth sores and voice change.   Eyes: Negative for pain and redness.  Respiratory: Negative for cough and shortness of breath.   Cardiovascular: Negative for chest pain and palpitations.  Genitourinary: Negative for dysuria and hematuria.  Musculoskeletal: Negative for arthralgias and myalgias.  Skin: Negative for pallor and rash.  Neurological: Negative for weakness and headaches.  Hematological: Negative for adenopathy.     Past Medical History: Past Medical History:  Diagnosis Date  . Anxiety   . Arthritis   . Astigmatism   . Cancer ([place])    skin cancer  . Constipation   . GERD (gastroesophageal reflux disease)    occasionally  . History of kidney stones   . Hyperlipidemia   . Hypertension   . Osteopenia   . Presbyopia   . Sleep apnea    mild sleep apnea    no  CPAP  .  Thyroid nodule      Past Surgical History: Past Surgical History:  Procedure Laterality Date  . BIOPSY THYROID    . CESAREAN SECTION  [id number]  . CHOLECYSTECTOMY N/A 03/16/2017   Procedure: LAPAROSCOPIC CHOLECYSTECTOMY WITH INTRAOPERATIVE CHOLANGIOGRAM,;  Surgeon: Alphonsa Overall, MD;  Location: [place];  Service: General;  Laterality: N/A;  lipoma on back  . COSMETIC SURGERY  2004   facial  . CYSTOSCOPY  1991  . CYSTOSCOPY/URETEROSCOPY/HOLMIUM LASER Right 12/21/2015   Procedure: CYSTOSCOPY, RETROGRADE PYELOGRAM,  URETEROSCOPY, BASKET STONE EXTRACTION, INSERTION DOUBLE J STENT ;  Surgeon: Carolan Clines, MD;  Location: [facility];  Service: Urology;  Laterality: Right;  . HEMORROIDECTOMY  1991  . HOLMIUM LASER APPLICATION Right 5/70/1779   Procedure: HOLMIUM LASER APPLICATION;  Surgeon: Carolan Clines, MD;  Location: [facility];  Service: Urology;  Laterality: Right;  . MASS EXCISION Right 03/16/2017   Procedure: EXCISION MASS;  Surgeon: Alphonsa Overall, MD;  Location: [place];  Service: General;  Laterality: Right;     Family History: Family History  Problem Relation Age of Onset  . Skin cancer Mother   . Other Mother        congestive heart disease  . Other Father        beginnings of prostate cancer  . Heart disease Father        venous insufficient  . Crohn's disease Brother     Social History: Social History   Socioeconomic History  . Marital status: Married  Spouse name: Not on file  . Number of children: 2  . Years of education: Not on file  . Highest education level: Not on file  Occupational History  . Occupation: school nutrition services asst.    Comment: [employer]  Tobacco Use  . Smoking status: Never Smoker  . Smokeless tobacco: Never Used  Vaping Use  . Vaping Use: Never used  Substance and Sexual Activity  . Alcohol use: Yes    Comment: rarely  . Drug use: Not Currently    Types: Marijuana    Comment: many,many years ago  . Sexual activity: Not  on file  Other Topics Concern  . Not on file  Social History Narrative  . Not on file   Social Determinants of Health   Financial Resource Strain: Not on file  Food Insecurity: Not on file  Transportation Needs: Not on file  Physical Activity: Not on file  Stress: Not on file  Social Connections: Not on file    Allergies: Allergies  Allergen Reactions  . Amlodipine Besylate Other (See Comments)    unknown  . Cephalexin Other (See Comments)    Vaginal yeast infections  . Chlorhexidine Itching    CHG clothes used to wipe prior to surgery caused itching.  . Erythromycin Other (See Comments)    Stomach pain  . Lisinopril     Cough   . Losartan Cough  . Macrobid [Nitrofurantoin] Diarrhea  . Olmesartan Medoxomil-Hctz Other (See Comments)    headaches  . Sulfa Antibiotics     Stomach pain  . Tetracyclines & Related     Pt preference: tooth discoloration.   . Valsartan Cough  . Zithromax [Azithromycin] Nausea And Vomiting  . Ceftin [Cefuroxime] Nausea And Vomiting    Outpatient Meds: Current Outpatient Medications  Medication Sig Dispense Refill  . atorvastatin (LIPITOR) 40 MG tablet Take 40 mg by mouth at bedtime.  0  . Calcium-Magnesium-Vitamin D (CITRACAL SLOW RELEASE PO) Take 2 tablets by mouth daily.    . cholecalciferol (VITAMIN D) 1000 units tablet Take 1,000 Units by mouth daily.    . hydrochlorothiazide (MICROZIDE) 12.5 MG capsule Take 1 capsule by mouth daily.    Marland Kitchen ibuprofen (ADVIL,MOTRIN) 200 MG tablet Take 400 mg by mouth daily as needed for headache or moderate pain.    Marland Kitchen irbesartan (AVAPRO) 150 MG tablet Take 1 tablet by mouth daily.    . methylcellulose (CITRUCEL) oral powder Take by mouth daily. One tablespoon daily    . Omega-3 1400 MG CAPS Take 1 capsule by mouth at bedtime.     No current facility-administered medications for this visit.      ___________________________________________________________________ Objective   Exam:  BP 110/64    Pulse 73   Ht 5' 1.25" (1.556 m)   Wt 152 lb (68.9 kg)   BMI 28.49 kg/m  Wt Readings from Last 3 Encounters:  05/04/21 152 lb (68.9 kg)  03/16/17 153 lb 1.6 oz (69.4 kg)  03/10/17 153 lb 1.6 oz (69.4 kg)     General: Well-appearing  Eyes: sclera anicteric, no redness  ENT: oral mucosa moist without lesions, no cervical or supraclavicular lymphadenopathy  CV: RRR without murmur, S1/S2, no JVD, no peripheral edema  Resp: clear to auscultation bilaterally, normal RR and effort noted  GI: soft, no tenderness, with active bowel sounds. No guarding or palpable organomegaly noted.  Skin; warm and dry, no rash or jaundice noted  Neuro: awake, alert and oriented x 3. Normal gross motor function  and fluent speech  Labs: No data, no primary care note. Patient received a call from their office last month that her Cologuard was positive.  Assessment: Encounter Diagnosis  Name Primary?  . Positive colorectal cancer screening using Cologuard test Yes    We discussed that a positive Cologuard may be 1 or more precancerous polyps, false positive or colon cancer. Colonoscopy is indicated, and she was agreeable after discussion of procedure and risks.  The benefits and risks of the planned procedure were described in detail with the patient or (when appropriate) their health care proxy.  Risks were outlined as including, but not limited to, bleeding, infection, perforation, adverse medication reaction leading to cardiac or pulmonary decompensation, pancreatitis (if ERCP).  The limitation of incomplete mucosal visualization was also discussed.  No guarantees or warranties were given.  She had questions regarding the technical aspects of the procedure and related to the bowel preparation, and all were answered to her satisfaction  Thank you for the courtesy of this consult.  Please call me with any questions or concerns.  Nelida Meuse III  CC: Referring provider noted above

## 2021-05-04 NOTE — Patient Instructions (Addendum)
If you are age 66 or older, your body mass index should be between 23-30. Your Body mass index is 28.49 kg/m. If this is out of the aforementioned range listed, please consider follow up with your Primary Care Provider.  If you are age 44 or younger, your body mass index should be between 19-25. Your Body mass index is 28.49 kg/m. If this is out of the aformentioned range listed, please consider follow up with your Primary Care Provider.   __________________________________________________________  The [facility] providers would like to encourage you to use [facility] to communicate with providers for non-urgent requests or questions.  Due to long hold times on the telephone, sending your provider a message by [facility] may be a faster and more efficient way to get a response.  Please allow 48 business hours for a response.  Please remember that this is for non-urgent requests.   You have been scheduled for a colonoscopy. Please follow written instructions given to you at your visit today.  Please pick up your prep supplies at the pharmacy within the next 1-3 days. If you use inhalers (even only as needed), please bring them with you on the day of your procedure.  Due to recent changes in healthcare laws, you may see the results of your imaging and laboratory studies on MyChart before your provider has had a chance to review them.  We understand that in some cases there may be results that are confusing or concerning to you. Not all laboratory results come back in the same time frame and the provider may be waiting for multiple results in order to interpret others.  Please give [place] 48 hours in order for your provider to thoroughly review all the results before contacting the office for clarification of your results.   Please stop the fiber supplements 5 days prior to the colonoscopy and use Miralax as needed.   It was a pleasure to see you today!  Thank you for trusting me with your gastrointestinal  care!

## 2021-05-18 ENCOUNTER — Telehealth: Payer: Self-pay | Admitting: Gastroenterology

## 2021-05-18 NOTE — Telephone Encounter (Signed)
Patient calling to inform she doesn't have her prep  med.. Plz advise  thanks

## 2021-05-19 MED ORDER — PLENVU 140 G PO SOLR: 140.0000 g | ORAL | 0 refills | Status: DC

## 2021-05-19 NOTE — Telephone Encounter (Signed)
Rx has been resent to the pharmacy

## 2021-05-20 NOTE — Telephone Encounter (Signed)
Inbound call from patient. States Plenvu is $141 and wondering if there are coupons or discount card. Says you can call her to discuss [phone]

## 2021-05-21 NOTE — Telephone Encounter (Signed)
Rx coupon has been faxed to the patients pharmacy. This should lower co-pay to no more than $60

## 2021-06-14 ENCOUNTER — Telehealth: Payer: Self-pay | Admitting: Gastroenterology

## 2021-06-14 NOTE — Telephone Encounter (Signed)
Spoke with patient. Informed her of MD recommendations. Pt verbalized understanding, and rescheduled to 8/8/2 at [facility] over new times for prep. Pt verbalized understanding, and had no further concerns.

## 2021-06-14 NOTE — Telephone Encounter (Signed)
Pt is scheduled to see Dr. Loletha Carrow for a colonoscopy on 7/19. Pt stated that she noticed some chest tightness, and a non productive cough starting last night when she was laying down. She took a at home covid test this morning, and it was negative. Pt wants to know if she would be able to proceed with procedure tomorrow. Please advise.

## 2021-06-14 NOTE — Telephone Encounter (Signed)
Thank you for the note.  Even though she is COVID-negative, she should not undergo sedation for an endoscopic procedure with symptoms that sound like a lower respiratory tract infection.  Her colonoscopy for tomorrow must be canceled and moved back 2 to 3 weeks (assuming her respiratory symptoms resolve before then).  Also recommended contacting her primary care provider if her respiratory symptoms do not improve within a few days.  - HD

## 2021-06-14 NOTE — Telephone Encounter (Signed)
Inbound call from patient. Have procedure 7/19. Called in states she woke up congestion in her chest and a cough. States her COVID test is negative, she has no fever, and is feeling fine. Best contact number [phone]

## 2021-06-15 ENCOUNTER — Encounter: Payer: Medicare Other | Admitting: Gastroenterology

## 2021-07-05 ENCOUNTER — Ambulatory Visit (AMBULATORY_SURGERY_CENTER): Payer: Medicare Other | Admitting: Gastroenterology

## 2021-07-05 ENCOUNTER — Encounter: Payer: Self-pay | Admitting: Gastroenterology

## 2021-07-05 ENCOUNTER — Other Ambulatory Visit: Payer: Self-pay

## 2021-07-05 VITALS — BP 100/68 | HR 65 | Temp 97.8°F | Resp 13 | Ht 61.0 in | Wt 152.0 lb

## 2021-07-05 DIAGNOSIS — K635 Polyp of colon: Secondary | ICD-10-CM | POA: Diagnosis not present

## 2021-07-05 DIAGNOSIS — R195 Other fecal abnormalities: Secondary | ICD-10-CM

## 2021-07-05 DIAGNOSIS — D120 Benign neoplasm of cecum: Secondary | ICD-10-CM | POA: Diagnosis not present

## 2021-07-05 DIAGNOSIS — D123 Benign neoplasm of transverse colon: Secondary | ICD-10-CM | POA: Diagnosis not present

## 2021-07-05 MED ORDER — SODIUM CHLORIDE 0.9 % IV SOLN: 500.0000 mL | Freq: Once | INTRAVENOUS | Status: AC

## 2021-07-05 NOTE — Progress Notes (Signed)
VS-CW 

## 2021-07-05 NOTE — Progress Notes (Signed)
Called to room to assist during endoscopic procedure.  Patient ID and intended procedure confirmed with present staff. Received instructions for my participation in the procedure from the performing physician.  

## 2021-07-05 NOTE — Progress Notes (Signed)
To PACU, VSS. Report to [email or web address] 

## 2021-07-05 NOTE — Op Note (Addendum)
[facility] Patient Name: Julia Sloan Procedure Date: 07/05/2021 7:52 AM MRN: [id number] Endoscopist: Mallie Mussel L. Loletha Carrow , MD Age: 65 Referring MD:  Date of Birth: 12-11-1955 Gender: Female Account #: [id number] Procedure:                Colonoscopy Indications:              Positive Cologuard test Medicines:                Monitored Anesthesia Care Procedure:                Pre-Anesthesia Assessment:                           - Prior to the procedure, a History and Physical                            was performed, and patient medications and                            allergies were reviewed. The patient's tolerance of                            previous anesthesia was also reviewed. The risks                            and benefits of the procedure and the sedation                            options and risks were discussed with the patient.                            All questions were answered, and informed consent                            was obtained. Prior Anticoagulants: The patient has                            taken no previous anticoagulant or antiplatelet                            agents. ASA Grade Assessment: II - A patient with                            mild systemic disease. After reviewing the risks                            and benefits, the patient was deemed in                            satisfactory condition to undergo the procedure.                           After obtaining informed consent, the colonoscope  was passed under direct vision. Throughout the                            procedure, the patient's blood pressure, pulse, and                            oxygen saturations were monitored continuously. The                            Olympus CF-HQ190L (Serial# [id number]) Colonoscope was                            introduced through the anus and advanced to the the                            cecum, identified by appendiceal  orifice and                            ileocecal valve. The colonoscopy was somewhat                            difficult due to a redundant colon. Successful                            completion of the procedure was aided by using                            manual pressure. The patient tolerated the                            procedure well. The quality of the bowel                            preparation was good. The ileocecal valve,                            appendiceal orifice, and rectum were photographed.                            The bowel preparation used was Plenvu. Scope In: 8:01:46 AM Scope Out: 8:24:37 AM Scope Withdrawal Time: 0 hours 16 minutes 47 seconds  Total Procedure Duration: 0 hours 22 minutes 51 seconds  Findings:                 The perianal and digital rectal examinations were                            normal.                           A small polyp was found in the appendiceal orifice.                            The polyp was semi-sessile. Biopsies were taken  with a cold forceps for histology.                           Two sessile polyps were found in the transverse                            colon. The polyps were 4 to 8 mm in size. These                            polyps were removed with a cold snare. Resection                            and retrieval were complete.                           Internal hemorrhoids were found. The hemorrhoids                            were Grade I (internal hemorrhoids that do not                            prolapse).                           The exam was otherwise without abnormality on                            direct and retroflexion views. Complications:            No immediate complications. Estimated Blood Loss:     Estimated blood loss was minimal. Impression:               - One small polyp at the appendiceal orifice.                            Biopsied.                           - Two  4 to 8 mm polyps in the transverse colon,                            removed with a cold snare. Resected and retrieved.                           - Internal hemorrhoids.                           - The examination was otherwise normal on direct                            and retroflexion views. Recommendation:           - Patient has a contact number available for                            emergencies. The signs and symptoms of potential  delayed complications were discussed with the                            patient. Return to normal activities tomorrow.                            Written discharge instructions were provided to the                            patient.                           - Resume previous diet.                           - Continue present medications.                           - Await pathology results.                           - Repeat colonoscopy is recommended for                            surveillance. The colonoscopy date will be                            determined after pathology results from today's                            exam become available for review.                           - If cecal biopsy results confirm adenoma or SSP as                            suspected, schedule CTAP and refer for surgical                            consultation.(patient would need extended                            appendectomy) Mallie Mussel L. Loletha Carrow, MD 07/05/2021 8:33:04 AM This report has been signed electronically.

## 2021-07-05 NOTE — Progress Notes (Signed)
History:  This patient presents for endoscopic testing for positive cologuard test. See 05/04/21 clinic note for details.  Geraldo Docker Referring physician: Deland Pretty, MD  Past Medical History: Past Medical History:  Diagnosis Date   Anxiety    Arthritis    Astigmatism    Cancer ([place])    skin cancer   Constipation    GERD (gastroesophageal reflux disease)    occasionally   History of kidney stones    Hyperlipidemia    Hypertension    Kidney stones    Osteopenia    Presbyopia    Sleep apnea    mild sleep apnea    no  CPAP   Thyroid nodule      Past Surgical History: Past Surgical History:  Procedure Laterality Date   BIOPSY THYROID     CESAREAN SECTION  [id number]   CHOLECYSTECTOMY N/A 03/16/2017   Procedure: LAPAROSCOPIC CHOLECYSTECTOMY WITH INTRAOPERATIVE CHOLANGIOGRAM,;  Surgeon: Alphonsa Overall, MD;  Location: [facility];  Service: General;  Laterality: N/A;  lipoma on back   COSMETIC SURGERY  2004   facial   CYSTOSCOPY  1991   CYSTOSCOPY/URETEROSCOPY/HOLMIUM LASER Right 12/21/2015   Procedure: CYSTOSCOPY, RETROGRADE PYELOGRAM,  URETEROSCOPY, BASKET STONE EXTRACTION, INSERTION DOUBLE J STENT ;  Surgeon: Carolan Clines, MD;  Location: [facility];  Service: Urology;  Laterality: Right;   HEMORROIDECTOMY  1991   HOLMIUM LASER APPLICATION Right [id number]   Procedure: HOLMIUM LASER APPLICATION;  Surgeon: Carolan Clines, MD;  Location: [facility];  Service: Urology;  Laterality: Right;   MASS EXCISION Right 03/16/2017   Procedure: EXCISION MASS;  Surgeon: Alphonsa Overall, MD;  Location: [facility];  Service: General;  Laterality: Right;    Allergies: Allergies  Allergen Reactions   Amlodipine Besylate Other (See Comments)    unknown   Cephalexin Other (See Comments)    Vaginal yeast infections   Chlorhexidine Itching    CHG clothes used to wipe prior to surgery caused itching.   Erythromycin Other (See Comments)    Stomach pain   Lisinopril     Cough    Losartan Cough    Nitrofurantoin Diarrhea    Other reaction(s): unsure occured n 2012   Olmesartan     Other reaction(s): Unknown   Olmesartan Medoxomil-Hctz Other (See Comments)    headaches   Sulfa Antibiotics     Stomach pain   Sulfamethoxazole-Trimethoprim     Other reaction(s): sulfa drug   Tetracyclines & Related     Pt preference: tooth discoloration.    Valsartan Cough   Zithromax [Azithromycin] Nausea And Vomiting   Ceftin [Cefuroxime] Nausea And Vomiting   Sulfamethoxazole Nausea Only    Outpatient Meds: Current Outpatient Medications  Medication Sig Dispense Refill   atorvastatin (LIPITOR) 40 MG tablet Take 40 mg by mouth at bedtime.  0   Calcium-Magnesium-Vitamin D (CITRACAL SLOW RELEASE PO) Take 2 tablets by mouth daily.     cholecalciferol (VITAMIN D) 1000 units tablet Take 1,000 Units by mouth daily.     hydrochlorothiazide (MICROZIDE) 12.5 MG capsule Take 1 capsule by mouth daily.     irbesartan (AVAPRO) 150 MG tablet Take 1 tablet by mouth daily.     Omega-3 1400 MG CAPS Take 1 capsule by mouth at bedtime.     ibuprofen (ADVIL,MOTRIN) 200 MG tablet Take 400 mg by mouth daily as needed for headache or moderate pain.     methylcellulose (CITRUCEL) oral powder Take by mouth daily. One tablespoon daily     Current  Facility-Administered Medications  Medication Dose Route Frequency Provider Last Rate Last Admin   0.9 %  sodium chloride infusion  500 mL Intravenous Once Nelida Meuse III, MD          ___________________________________________________________________ Objective   Exam:  BP 122/81   Pulse 91   Temp 97.8 F (36.6 C) (Temporal)   Ht '5\' 1"'$  (1.549 m)   Wt 152 lb (68.9 kg)   SpO2 93%   BMI 28.72 kg/m   CV: RRR without murmur, S1/S2, no JVD, no peripheral edema Resp: clear to auscultation bilaterally, normal RR and effort noted GI: soft, no tenderness, with active bowel sounds.   Assessment:  Positive cologuard  Plan: Colonoscopy    Nelida Meuse  III

## 2021-07-05 NOTE — Patient Instructions (Signed)
Resume previous diet and medications. Awaiting pathology results. Repeat colonoscopy for surveillance, date to be determined after pathology results are available.  If Cecal biopsy results confirm adenoma or SSP as suspected. Schedule CTAP and refer for surgical consultation. (Patient would need extended appendectomy)  YOU HAD AN ENDOSCOPIC PROCEDURE TODAY AT [facility]:   Refer to the procedure report that was given to you for any specific questions about what was found during the examination.  If the procedure report does not answer your questions, please call your gastroenterologist to clarify.  If you requested that your care partner not be given the details of your procedure findings, then the procedure report has been included in a sealed envelope for you to review at your convenience later.  YOU SHOULD EXPECT: Some feelings of bloating in the abdomen. Passage of more gas than usual.  Walking can help get rid of the air that was put into your GI tract during the procedure and reduce the bloating. If you had a lower endoscopy (such as a colonoscopy or flexible sigmoidoscopy) you may notice spotting of blood in your stool or on the toilet paper. If you underwent a bowel prep for your procedure, you may not have a normal bowel movement for a few days.  Please Note:  You might notice some irritation and congestion in your nose or some drainage.  This is from the oxygen used during your procedure.  There is no need for concern and it should clear up in a day or so.  SYMPTOMS TO REPORT IMMEDIATELY:  Following lower endoscopy (colonoscopy or flexible sigmoidoscopy):  Excessive amounts of blood in the stool  Significant tenderness or worsening of abdominal pains  Swelling of the abdomen that is new, acute  Fever of 100F or higher  For urgent or emergent issues, a gastroenterologist can be reached at any hour by calling [phone]. Do not use MyChart messaging for urgent  concerns.    DIET:  We do recommend a small meal at first, but then you may proceed to your regular diet.  Drink plenty of fluids but you should avoid alcoholic beverages for 24 hours.  ACTIVITY:  You should plan to take it easy for the rest of today and you should NOT DRIVE or use heavy machinery until tomorrow (because of the sedation medicines used during the test).    FOLLOW UP: Our staff will call the number listed on your records 48-72 hours following your procedure to check on you and address any questions or concerns that you may have regarding the information given to you following your procedure. If we do not reach you, we will leave a message.  We will attempt to reach you two times.  During this call, we will ask if you have developed any symptoms of COVID 19. If you develop any symptoms (ie: fever, flu-like symptoms, shortness of breath, cough etc.) before then, please call [phone].  If you test positive for Covid 19 in the 2 weeks post procedure, please call and report this information to [place].    If any biopsies were taken you will be contacted by phone or by letter within the next 1-3 weeks.  Please call us at [phone] if you have not heard about the biopsies in 3 weeks.    SIGNATURES/CONFIDENTIALITY: You and/or your care partner have signed paperwork which will be entered into your electronic medical record.  These signatures attest to the fact that that the information above on your  After Visit Summary has been reviewed and is understood.  Full responsibility of the confidentiality of this discharge information lies with you and/or your care-partner.

## 2021-07-07 ENCOUNTER — Telehealth: Payer: Self-pay

## 2021-07-07 NOTE — Telephone Encounter (Signed)
Left message on follow up call. 

## 2021-07-07 NOTE — Telephone Encounter (Signed)
  Follow up Call-  Call back number 07/05/2021  Post procedure Call Back phone  # [phone]  Permission to leave phone message Yes  Some recent data might be hidden     Patient questions:  Do you have a fever, pain , or abdominal swelling? No. Pain Score  0 *  Have you tolerated food without any problems? Yes.    Have you been able to return to your normal activities? Yes.    Do you have any questions about your discharge instructions: Diet   No. Medications  No. Follow up visit  No.  Do you have questions or concerns about your Care? No.  Actions: * If pain score is 4 or above: No action needed, pain <4. Have you developed a fever since your procedure? no  2.   Have you had an respiratory symptoms (SOB or cough) since your procedure? no  3.   Have you tested positive for COVID 19 since your procedure no  4.   Have you had any family members/close contacts diagnosed with the COVID 19 since your procedure?  no   If yes to any of these questions please route to Joylene John, RN and Joella Prince, RN

## 2021-07-09 ENCOUNTER — Encounter: Payer: Self-pay | Admitting: Gastroenterology

## 2021-07-09 ENCOUNTER — Other Ambulatory Visit: Payer: Self-pay

## 2021-07-09 DIAGNOSIS — D123 Benign neoplasm of transverse colon: Secondary | ICD-10-CM

## 2021-07-09 DIAGNOSIS — R195 Other fecal abnormalities: Secondary | ICD-10-CM

## 2021-07-09 DIAGNOSIS — D120 Benign neoplasm of cecum: Secondary | ICD-10-CM

## 2021-07-14 ENCOUNTER — Telehealth: Payer: Self-pay | Admitting: Gastroenterology

## 2021-07-14 NOTE — Telephone Encounter (Signed)
Lm on vm for patient to return call 

## 2021-07-14 NOTE — Telephone Encounter (Signed)
Pls call pt, she has some questions about upcoming CT scan.

## 2021-07-15 NOTE — Telephone Encounter (Signed)
See 07/14/21 patient message for more information.

## 2021-07-26 ENCOUNTER — Ambulatory Visit (HOSPITAL_COMMUNITY): Payer: Medicare Other

## 2021-07-30 ENCOUNTER — Ambulatory Visit (HOSPITAL_COMMUNITY)
Admission: RE | Admit: 2021-07-30 | Discharge: 2021-07-30 | Disposition: A | Discharge status: Home / Self Care | Payer: Medicare Other | Source: Ambulatory Visit | Attending: Gastroenterology | Admitting: Gastroenterology

## 2021-07-30 ENCOUNTER — Encounter (HOSPITAL_COMMUNITY): Payer: Self-pay

## 2021-07-30 ENCOUNTER — Other Ambulatory Visit: Payer: Self-pay

## 2021-07-30 DIAGNOSIS — R195 Other fecal abnormalities: Secondary | ICD-10-CM | POA: Insufficient documentation

## 2021-07-30 DIAGNOSIS — D120 Benign neoplasm of cecum: Secondary | ICD-10-CM | POA: Diagnosis present

## 2021-07-30 DIAGNOSIS — D123 Benign neoplasm of transverse colon: Secondary | ICD-10-CM | POA: Diagnosis present

## 2021-07-30 LAB — POCT I-STAT CREATININE: Creatinine, Ser: 0.6 mg/dL (ref 0.44–1.00)

## 2021-07-30 MED ORDER — IOHEXOL 9 MG/ML PO SOLN
ORAL | Status: AC
  Filled 2021-07-30: qty 1000

## 2021-07-30 MED ORDER — IOHEXOL 9 MG/ML PO SOLN: 500.0000 mL | ORAL | Status: AC

## 2021-07-30 MED ORDER — IOHEXOL 350 MG/ML SOLN
75.0000 mL | Freq: Once | INTRAVENOUS | Status: AC | PRN
  Administered 2021-07-30: 75 mL via INTRAVENOUS

## 2021-08-10 NOTE — Telephone Encounter (Signed)
CT report faxed to patient's PCP.

## 2021-08-20 ENCOUNTER — Other Ambulatory Visit: Payer: Self-pay | Admitting: Internal Medicine

## 2021-08-20 DIAGNOSIS — I700 Atherosclerosis of aorta: Secondary | ICD-10-CM

## 2021-09-09 ENCOUNTER — Ambulatory Visit
Admission: RE | Admit: 2021-09-09 | Discharge: 2021-09-09 | Disposition: A | Discharge status: Home / Self Care | Payer: No Typology Code available for payment source | Source: Ambulatory Visit | Attending: Internal Medicine | Admitting: Internal Medicine

## 2021-09-09 DIAGNOSIS — I700 Atherosclerosis of aorta: Secondary | ICD-10-CM

## 2021-10-06 NOTE — Telephone Encounter (Signed)
I called [facility] to follow up on referral. Patient is not currently scheduled for an appt. [facility] states that they were behind on referral but reached out to her on 09/09/21 and had to leave a vm for her to call back. Did not schedule appt for patient because she works for [employer] and not sure what her schedule is like. My chart message sent to patient to call [facility] to set up her appt.

## 2021-10-06 NOTE — Telephone Encounter (Signed)
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  Recent chart notes indicate she had not heard from them.  Thanks   - HD

## 2021-12-01 IMAGING — CT CT CARDIAC CORONARY ARTERY CALCIUM SCORE
3 series · 14 of 20 positions shown, 16 images · non-contrast
Comparison: None.

CLINICAL DATA: 65-year-old Caucasian female with history of
hyperlipidemia, hypertension and aortic atherosclerosis of the
abdominal aorta.

EXAM:
CT CARDIAC CORONARY ARTERY CALCIUM SCORE
TECHNIQUE: Non-contrast imaging through the heart was performed using
prospective ECG gating. Image post processing was performed on an
independent workstation, allowing for quantitative analysis of the
heart and coronary arteries. Note that this exam targets the heart
and the chest was not imaged in its entirety.

[Series 2: calcium scoring 2.00 qr36 bestdiast 67% hrt calciu · axial · 0.37mm/px · z∈[+1894,+1960]mm · 4 of 57 slices shown]
[im 12/57  vessel]
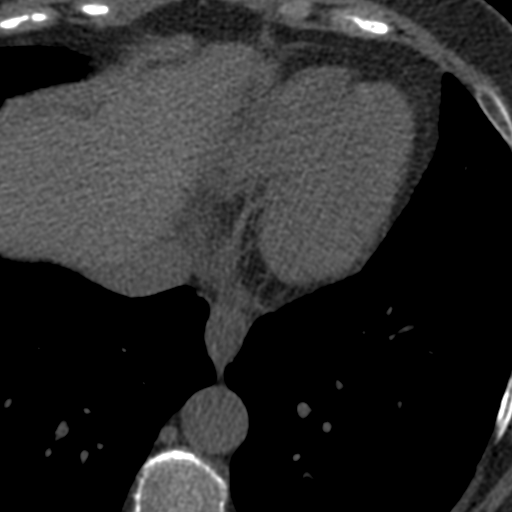
[im 23/57  vessel]
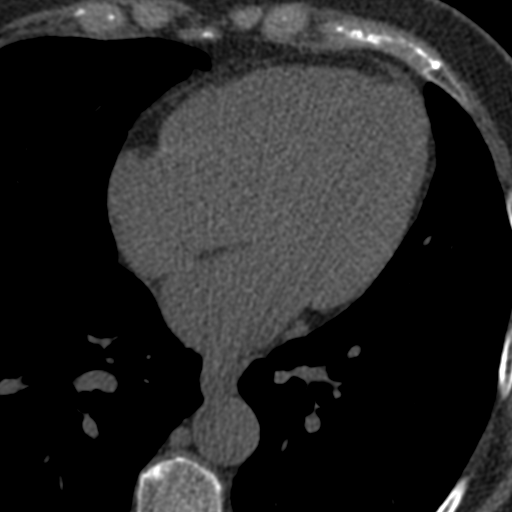
[im 34/57  vessel]
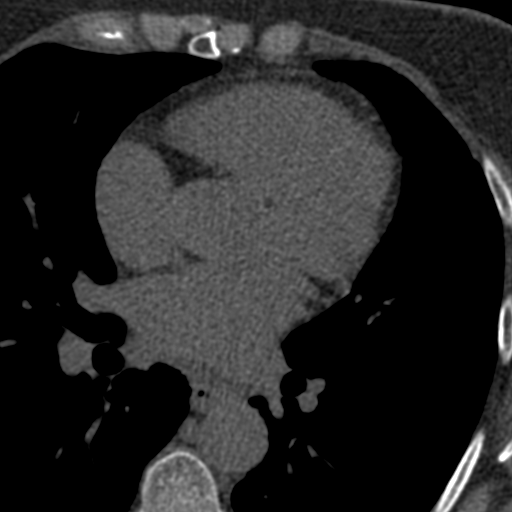
[im 45/57  vessel]
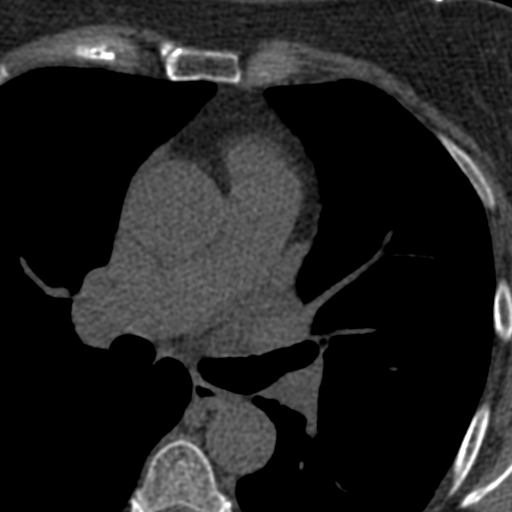

[Series 3: calcium scoring 2.00 br40 bestdiast 67% axial · axial · 0.57mm/px · z∈[+1890,+1964]mm · 5 of 57 slices shown, 7 images]
[im 10/57  vessel]
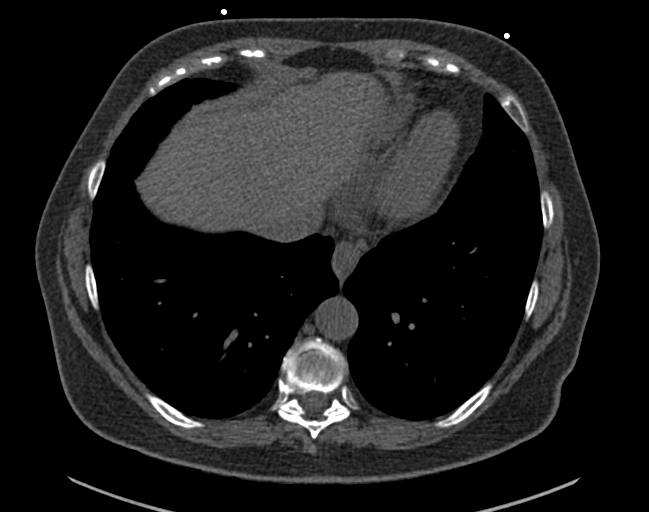
[im 10/57  lung]
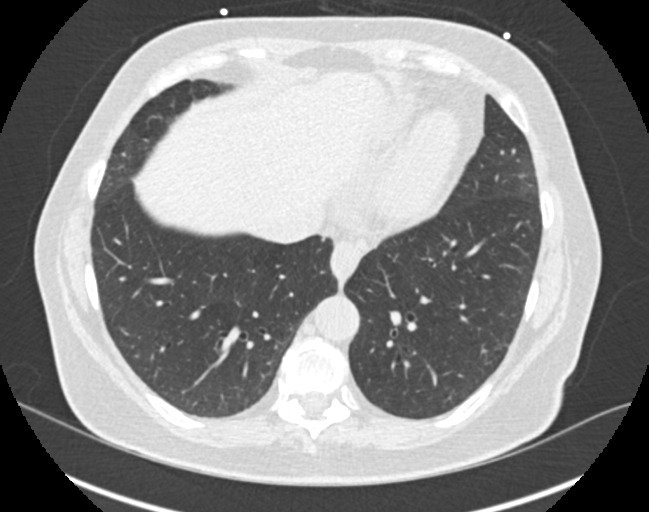
[im 19/57  vessel]
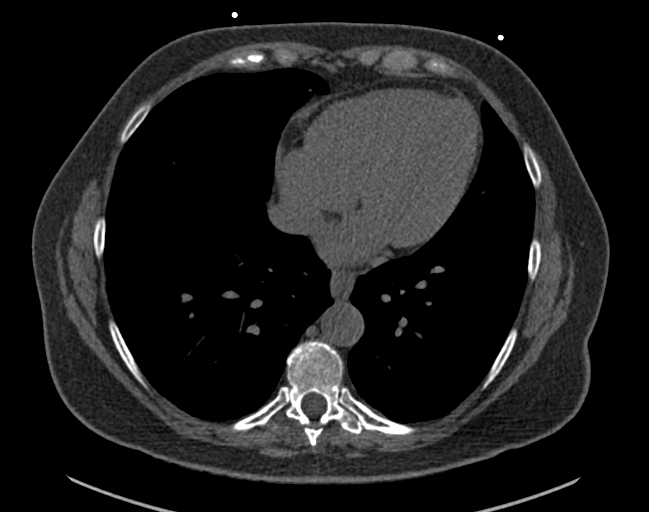
[im 29/57  vessel]
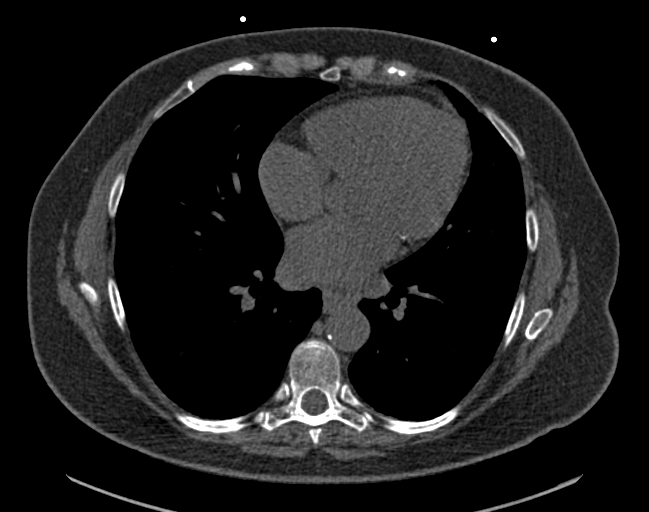
[im 38/57  vessel]
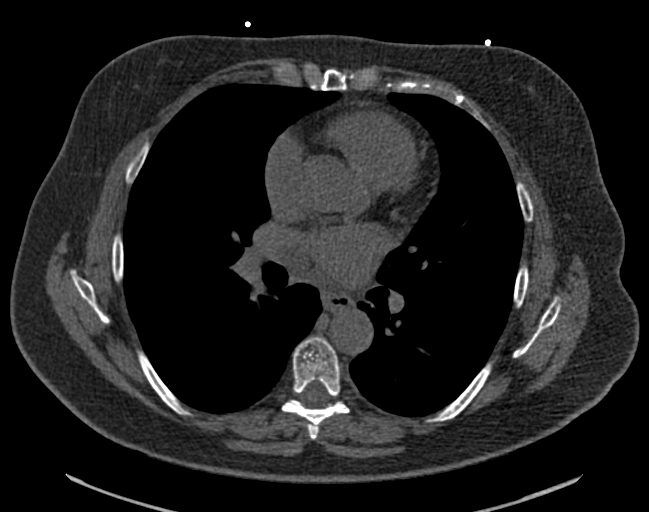
[im 47/57  vessel]
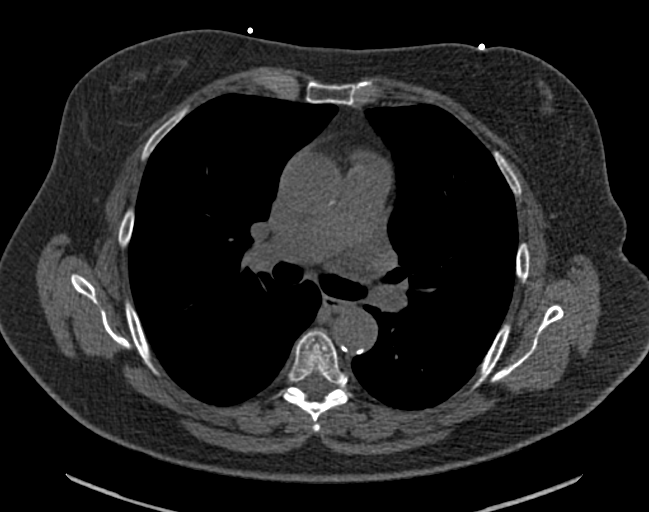
[im 47/57  lung]
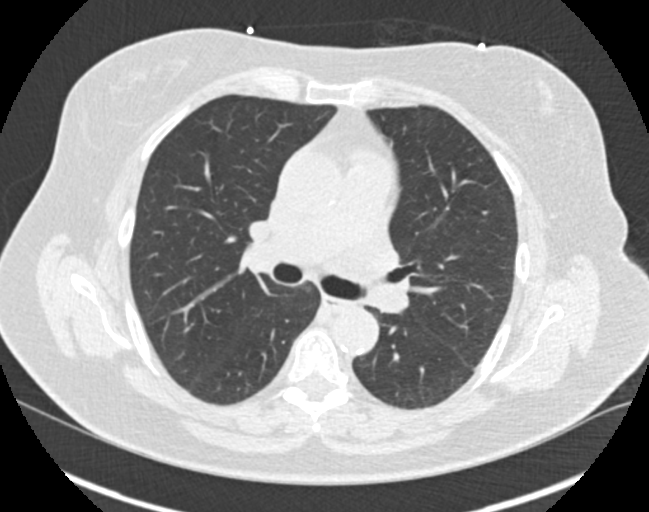

[Series 9: calcium scoring 2.00 br60 bestdiast 67% lungs · axial · 0.57mm/px · z∈[+1890,+1964]mm · 5 of 57 slices shown]
[im 10/57  vessel]
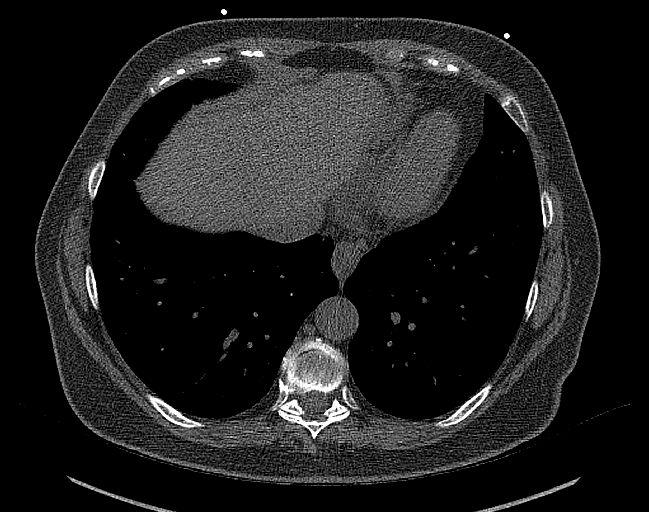
[im 19/57  vessel]
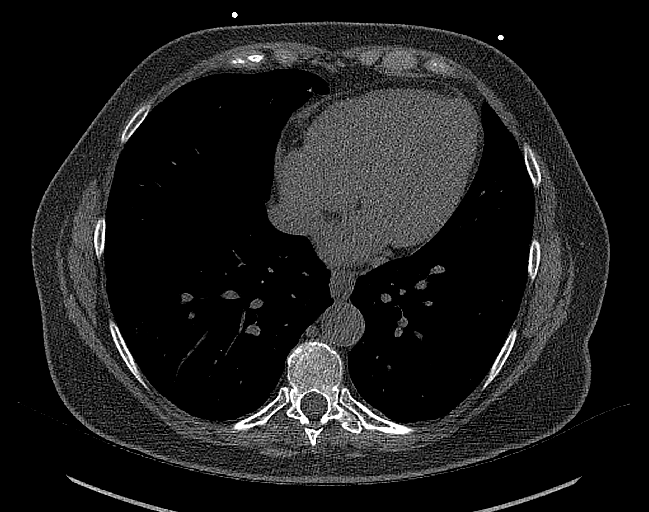
[im 29/57  vessel]
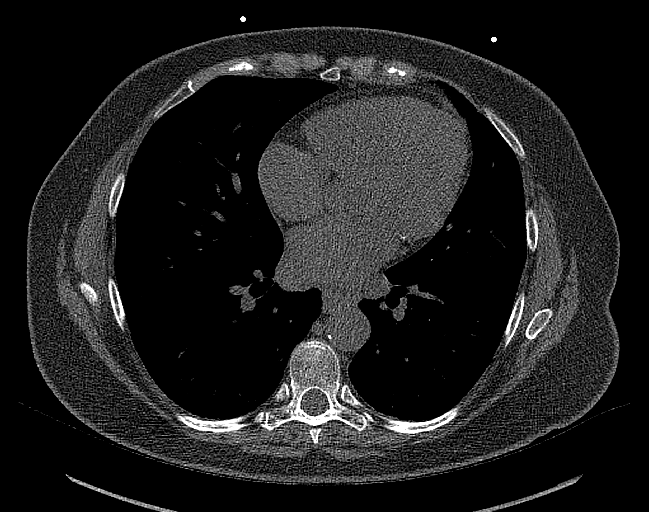
[im 38/57  vessel]
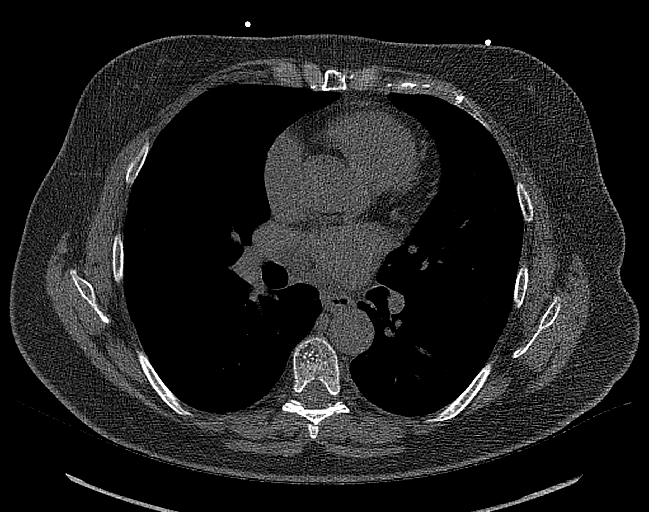
[im 47/57  vessel]
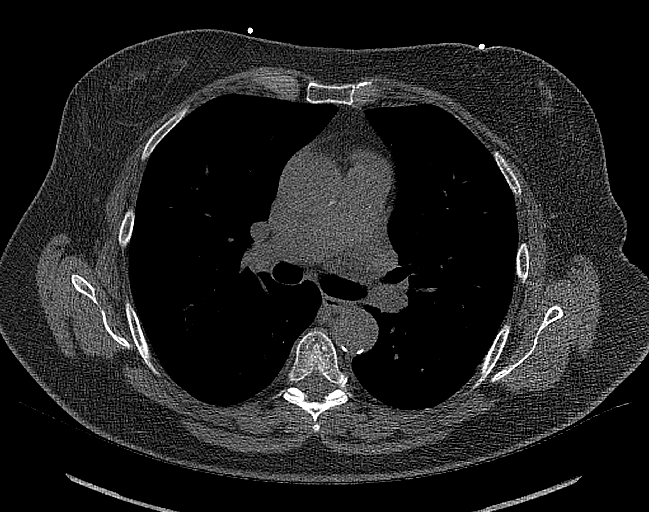

[14 of 20 positions shown; findings below may reference images not displayed]

FINDINGS: CORONARY CALCIUM SCORES:

Left Main: 0

LAD: 0

LCx: 0

RCA: 0

Total Agatston Score: 0

[HOSPITAL] percentile: 0

AORTA MEASUREMENTS:

Ascending Aorta: 34 mm

Descending Aorta: 25 mm

OTHER FINDINGS:

The heart size is within normal limits. No pericardial fluid is
identified. Mild scattered calcifications in the descending thoracic
aorta. Visualized segments of the thoracic aorta and central
pulmonary arteries are normal in caliber. Visualized mediastinum and
hilar regions demonstrate no lymphadenopathy or masses. Visualized
lungs show no evidence of pulmonary edema, consolidation,
pneumothorax, nodule or pleural fluid. Visualized upper abdomen and
bony structures are unremarkable.
IMPRESSION: 1. Coronary calcium score of 0.
2. Mild atherosclerosis of the descending thoracic aorta.

## 2022-01-11 ENCOUNTER — Telehealth: Payer: Self-pay | Admitting: Gastroenterology

## 2022-01-11 NOTE — Telephone Encounter (Signed)
Office note from Dr. Dema Severin at [facility] surgery reviewed, appendectomy is planned for cecal polyp discovered on colonoscopy August 2022.  Please set a colonoscopy recall for March 2024  - HD

## 2022-01-11 NOTE — Telephone Encounter (Signed)
done

## 2022-01-18 ENCOUNTER — Ambulatory Visit: Payer: Self-pay | Admitting: Surgery

## 2022-01-18 DIAGNOSIS — Z01818 Encounter for other preprocedural examination: Secondary | ICD-10-CM

## 2022-01-18 NOTE — Progress Notes (Signed)
Sent message, via epic in basket, requesting orders in epic from surgeon.  

## 2022-01-19 NOTE — Patient Instructions (Addendum)
DUE TO COVID-19 ONLY ONE VISITOR IS ALLOWED TO COME WITH YOU AND STAY IN THE WAITING ROOM ONLY DURING PRE OP AND PROCEDURE DAY OF SURGERY.   Up to two visitors ages 16+ are allowed at one time in a patient's room.  The visitors may rotate out with other people throughout the day.  Additionally, up to two children between the ages of 55 and 37 are allowed and do not count toward the number of allowed visitors.  Children within this age range must be accompanied by an adult visitor.  One adult visitor may remain with the patient overnight and must be in the room by 8 PM.            Your procedure is scheduled on: 01/27/22   Report to [facility] Main  Entrance  Report to admitting at 12:00 PM     Call this number if you have problems the morning of surgery [phone]   Follow all instructions from the surgeon's office for diet and bowel prep.    Drink plenty of fluids on the day of prep to prevent dehydration.   DRINK 2 PRESURGERY  DRINKS THE NIGHT BEFORE SURGERY AT 10:00 PM .   NO SOLIDS AFTER MIDNIGHT THE DAY PRIOR TO THE SURGERY.   NOTHING BY MOUTH EXCEPT CLEAR LIQUIDS UNTIL :11:00 AM  CLEAR LIQUID DIET  Foods Allowed                                                                     Exclude  Water, Black Coffee, and tea, regular and decaf             ALL SOLID FOODS! Plain Jell-O in any flavor  (No red)                                     Fruit ices (not with fruit pulp)                                            MILK, SOUPS, Iced Popsicles (No red)                                                                            Apple juices                                                                         ORANGE JUICE Sports drinks like Gatorade (No red)  Lightly seasoned clear broth or consume(fat free) Sugar/Sweetner   THREE HOURS PRIOR TO SCHEDULED SURGERY PLEASE FINISH THE  3rd  PRESURGICAL DRINK .AT 10:30 AM  NOTHING MORE BY  MOUTH AFTER: 11:00 am     BRUSH YOUR TEETH MORNING OF SURGERY AND RINSE YOUR MOUTH OUT, NO CHEWING GUM CANDY OR MINTS.     Take these medicines the morning of surgery with A SIP OF WATER: NONE                                You may not have any metal on your body including hair pins and piercings .             Do not wear jewelry,  lotions, powders, deodorant or perfume/cologne.             Do not wear make up .             Do not wear nail polish or nail product on your fingernails or toenails .               Do not shave legs or under arms 48 hours prior to surgery.              Men may shave face and neck.    Do not bring valuables to the hospital. [place].   Contacts, dentures or bridgework may not be worn into surgery.       Patients discharged the day of surgery will not be allowed to drive home.   IF YOU ARE HAVING SURGERY AND GOING HOME THE SAME DAY, YOU MUST HAVE AN ADULT TO DRIVE YOU HOME AND BE WITH YOU FOR 24 HOURS.  YOU MAY GO HOME BY TAXI OR UBER OR ORTHERWISE, BUT AN ADULT MUST ACCOMPANY YOU HOME AND STAY WITH YOU FOR 24 HOURS.   _____________________________________________________________________             [facility] - Preparing for Surgery Before surgery, you can play an important role.  Because skin is not sterile, your skin needs to be as free of germs as possible.  You can reduce the number of germs on your skin by washing with CHG (chlorahexidine gluconate) soap before surgery.  CHG is an antiseptic cleaner which kills germs and bonds with the skin to continue killing germs even after washing. Please DO NOT use if you have an allergy to CHG or antibacterial soaps.   If your skin becomes reddened/irritated stop using . You may use Dial soap or any antibacterial soap instead.  Women :Do not shave (including legs and underarms) for at least 48 hours prior to the first CHG shower.   Men: You may shave your  face/neck.  Please follow these instructions carefully:  1.  Shower with CHG Soap the night before surgery and the  morning of Surgery.  2.  If you choose to wash your hair, wash your hair first as usual with your  normal  shampoo.  3.  After you shampoo, rinse your hair and body thoroughly to remove the  shampoo.                            4.  Use CHG as you would any other liquid soap.  You can apply chg directly  to the skin and wash  Gently with a scrungie or clean washcloth.  5.  Apply the CHG Soap to your body ONLY FROM THE NECK DOWN.   Do not use on face/ open                           Wound or open sores. Avoid contact with eyes, ears mouth and genitals (private parts).                       Wash face,  Genitals (private parts) with your normal soap.             6.  Wash thoroughly, paying special attention to the area where your surgery  will be performed.  7.  Thoroughly rinse your body with warm water from the neck down.  8.  DO NOT shower/wash with your normal soap after using and rinsing off  the CHG Soap.             9.  Pat yourself dry with a clean towel.            10.  Wear clean pajamas.            11.  Place clean sheets on your bed the night of your first shower and do not  sleep with pets.  Day of Surgery : Do not apply any lotions/deodorants the morning of surgery.  Please wear clean clothes to the hospital/surgery center.  FAILURE TO FOLLOW THESE INSTRUCTIONS MAY RESULT IN THE CANCELLATION OF YOUR SURGERY   PATIENT SIGNATURE_________________________________    ________________________________________________________________________

## 2022-01-20 ENCOUNTER — Other Ambulatory Visit: Payer: Self-pay

## 2022-01-20 ENCOUNTER — Encounter (HOSPITAL_COMMUNITY): Payer: Self-pay

## 2022-01-20 ENCOUNTER — Encounter (HOSPITAL_COMMUNITY)
Admission: RE | Admit: 2022-01-20 | Discharge: 2022-01-20 | Disposition: A | Discharge status: Home / Self Care | Payer: Medicare Other | Source: Ambulatory Visit | Attending: Surgery | Admitting: Surgery

## 2022-01-20 VITALS — BP 110/82 | HR 72 | Temp 98.4°F | Resp 18 | Ht 61.0 in | Wt 159.2 lb

## 2022-01-20 DIAGNOSIS — Z01818 Encounter for other preprocedural examination: Secondary | ICD-10-CM | POA: Insufficient documentation

## 2022-01-20 LAB — CBC WITH DIFFERENTIAL/PLATELET
Abs Immature Granulocytes: 0.02 10*3/uL (ref 0.00–0.07)
Basophils Absolute: 0.1 10*3/uL (ref 0.0–0.1)
Basophils Relative: 1 %
Eosinophils Absolute: 0.1 10*3/uL (ref 0.0–0.5)
Eosinophils Relative: 2 %
HCT: 40.3 % (ref 36.0–46.0)
Hemoglobin: 13.2 g/dL (ref 12.0–15.0)
Immature Granulocytes: 0 %
Lymphocytes Relative: 27 %
Lymphs Abs: 1.9 10*3/uL (ref 0.7–4.0)
MCH: 29.1 pg (ref 26.0–34.0)
MCHC: 32.8 g/dL (ref 30.0–36.0)
MCV: 88.8 fL (ref 80.0–100.0)
Monocytes Absolute: 0.6 10*3/uL (ref 0.1–1.0)
Monocytes Relative: 8 %
Neutro Abs: 4.4 10*3/uL (ref 1.7–7.7)
Neutrophils Relative %: 62 %
Platelets: 258 10*3/uL (ref 150–400)
RBC: 4.54 MIL/uL (ref 3.87–5.11)
RDW: 12.7 % (ref 11.5–15.5)
WBC: 7 10*3/uL (ref 4.0–10.5)
nRBC: 0 % (ref 0.0–0.2)

## 2022-01-20 LAB — BASIC METABOLIC PANEL
Anion gap: 6 (ref 5–15)
BUN: 19 mg/dL (ref 8–23)
CO2: 27 mmol/L (ref 22–32)
Calcium: 9.3 mg/dL (ref 8.9–10.3)
Chloride: 102 mmol/L (ref 98–111)
Creatinine, Ser: 0.66 mg/dL (ref 0.44–1.00)
GFR, Estimated: >60 mL/min (ref >60)
Glucose, Bld: 96 mg/dL (ref 70–99)
Potassium: 4.1 mmol/L (ref 3.5–5.1)
Sodium: 135 mmol/L (ref 135–145)

## 2022-01-20 LAB — GLUCOSE, CAPILLARY: Glucose-Capillary: 88 mg/dL (ref 70–99)

## 2022-01-20 NOTE — Progress Notes (Signed)
COVID test-NA   Bowel prep reminder:yes-reviewed with Pt  PCP - Dr. Audie Pinto Cardiologist - None  Chest x-ray - no EKG - 01/20/22-chart Stress Test - no ECHO - no Cardiac Cath - no Pacemaker/ICD device last checked:NA  Sleep Study -  CPAP -   Fasting Blood Sugar -  Checks Blood Sugar _____ times a day  Blood Thinner Instructions:NA Aspirin Instructions: Last Dose:  Anesthesia review: no  Patient denies shortness of breath, fever, cough and chest pain at PAT appointment Pt has no SOB with activities  Patient verbalized understanding of instructions that were given to them at the PAT appointment. Patient was also instructed that they will need to review over the PAT instructions again at home before surgery. Yes. All questions answered

## 2022-01-21 LAB — HEMOGLOBIN A1C
Hgb A1c MFr Bld: 5.7 % — ABNORMAL HIGH (ref 4.8–5.6)
Mean Plasma Glucose: 117 mg/dL

## 2022-01-27 ENCOUNTER — Other Ambulatory Visit: Payer: Self-pay

## 2022-01-27 ENCOUNTER — Ambulatory Visit (HOSPITAL_COMMUNITY): Payer: Medicare Other | Admitting: Certified Registered Nurse Anesthetist

## 2022-01-27 ENCOUNTER — Encounter (HOSPITAL_COMMUNITY)
Admission: RE | Disposition: A | Discharge status: Home / Self Care | Payer: Self-pay | Source: Home / Self Care | Attending: Surgery

## 2022-01-27 ENCOUNTER — Ambulatory Visit (HOSPITAL_BASED_OUTPATIENT_CLINIC_OR_DEPARTMENT_OTHER): Payer: Medicare Other | Admitting: Certified Registered Nurse Anesthetist

## 2022-01-27 ENCOUNTER — Encounter (HOSPITAL_COMMUNITY): Payer: Self-pay | Admitting: Surgery

## 2022-01-27 ENCOUNTER — Ambulatory Visit (HOSPITAL_COMMUNITY)
Admission: RE | Admit: 2022-01-27 | Discharge: 2022-01-27 | Disposition: A | Discharge status: Home / Self Care | Payer: Medicare Other | Attending: Surgery | Admitting: Surgery

## 2022-01-27 DIAGNOSIS — E669 Obesity, unspecified: Secondary | ICD-10-CM | POA: Insufficient documentation

## 2022-01-27 DIAGNOSIS — I10 Essential (primary) hypertension: Secondary | ICD-10-CM | POA: Insufficient documentation

## 2022-01-27 DIAGNOSIS — E785 Hyperlipidemia, unspecified: Secondary | ICD-10-CM | POA: Insufficient documentation

## 2022-01-27 DIAGNOSIS — K388 Other specified diseases of appendix: Secondary | ICD-10-CM

## 2022-01-27 DIAGNOSIS — D120 Benign neoplasm of cecum: Secondary | ICD-10-CM | POA: Diagnosis present

## 2022-01-27 DIAGNOSIS — R7303 Prediabetes: Secondary | ICD-10-CM

## 2022-01-27 DIAGNOSIS — K219 Gastro-esophageal reflux disease without esophagitis: Secondary | ICD-10-CM | POA: Diagnosis not present

## 2022-01-27 HISTORY — PX: LAPAROSCOPIC APPENDECTOMY: SHX408

## 2022-01-27 LAB — TYPE AND SCREEN
ABO/RH(D): O NEG
Antibody Screen: NEGATIVE

## 2022-01-27 LAB — GLUCOSE, CAPILLARY: Glucose-Capillary: 113 mg/dL — ABNORMAL HIGH (ref 70–99)

## 2022-01-27 LAB — ABO/RH: ABO/RH(D): O NEG

## 2022-01-27 SURGERY — APPENDECTOMY, LAPAROSCOPIC
Anesthesia: General | Site: Abdomen

## 2022-01-27 MED ORDER — BUPIVACAINE-EPINEPHRINE (PF) 0.25% -1:200000 IJ SOLN
INTRAMUSCULAR | Status: AC
  Filled 2022-01-27: qty 30

## 2022-01-27 MED ORDER — ALVIMOPAN 12 MG PO CAPS
12.0000 mg | ORAL_CAPSULE | ORAL | Status: AC
  Administered 2022-01-27: 12 mg via ORAL
  Filled 2022-01-27: qty 1

## 2022-01-27 MED ORDER — SODIUM CHLORIDE 0.9 % IV SOLN
2.0000 g | INTRAVENOUS | Status: AC
  Administered 2022-01-27: 2 g via INTRAVENOUS
  Filled 2022-01-27: qty 2

## 2022-01-27 MED ORDER — 0.9 % SODIUM CHLORIDE (POUR BTL) OPTIME
TOPICAL | Status: DC | PRN
  Administered 2022-01-27: 1000 mL

## 2022-01-27 MED ORDER — ACETAMINOPHEN 500 MG PO TABS
1000.0000 mg | ORAL_TABLET | ORAL | Status: AC
  Administered 2022-01-27: 1000 mg via ORAL
  Filled 2022-01-27: qty 2

## 2022-01-27 MED ORDER — BUPIVACAINE-EPINEPHRINE (PF) 0.25% -1:200000 IJ SOLN
INTRAMUSCULAR | Status: DC | PRN
  Administered 2022-01-27: 30 mL via PERINEURAL

## 2022-01-27 MED ORDER — FENTANYL CITRATE (PF) 100 MCG/2ML IJ SOLN
INTRAMUSCULAR | Status: AC
  Filled 2022-01-27: qty 2

## 2022-01-27 MED ORDER — HEPARIN SODIUM (PORCINE) 5000 UNIT/ML IJ SOLN
5000.0000 [IU] | Freq: Once | INTRAMUSCULAR | Status: AC
  Administered 2022-01-27: 5000 [IU] via SUBCUTANEOUS
  Filled 2022-01-27: qty 1

## 2022-01-27 MED ORDER — POLYETHYLENE GLYCOL 3350 17 GM/SCOOP PO POWD: 1.0000 | Freq: Once | ORAL | Status: DC

## 2022-01-27 MED ORDER — DIPHENHYDRAMINE HCL 50 MG/ML IJ SOLN
INTRAMUSCULAR | Status: AC
  Filled 2022-01-27: qty 1

## 2022-01-27 MED ORDER — TRAMADOL HCL 50 MG PO TABS: 50.0000 mg | ORAL_TABLET | Freq: Four times a day (QID) | ORAL | 0 refills | Status: AC | PRN

## 2022-01-27 MED ORDER — DEXAMETHASONE SODIUM PHOSPHATE 10 MG/ML IJ SOLN
INTRAMUSCULAR | Status: AC
  Filled 2022-01-27: qty 1

## 2022-01-27 MED ORDER — PROPOFOL 10 MG/ML IV BOLUS
INTRAVENOUS | Status: DC | PRN
  Administered 2022-01-27: 180 mg via INTRAVENOUS

## 2022-01-27 MED ORDER — DIPHENHYDRAMINE HCL 50 MG/ML IJ SOLN
INTRAMUSCULAR | Status: DC | PRN
  Administered 2022-01-27: 12.5 mg via INTRAVENOUS

## 2022-01-27 MED ORDER — LIDOCAINE HCL (PF) 2 % IJ SOLN
INTRAMUSCULAR | Status: AC
  Filled 2022-01-27: qty 5

## 2022-01-27 MED ORDER — CHLORHEXIDINE GLUCONATE 0.12 % MT SOLN
15.0000 mL | Freq: Once | OROMUCOSAL | Status: AC
Start: 2022-01-27 — End: 2022-01-27

## 2022-01-27 MED ORDER — MIDAZOLAM HCL 2 MG/2ML IJ SOLN
INTRAMUSCULAR | Status: AC
  Filled 2022-01-27: qty 2

## 2022-01-27 MED ORDER — ONDANSETRON HCL 4 MG/2ML IJ SOLN
INTRAMUSCULAR | Status: AC
  Filled 2022-01-27: qty 2

## 2022-01-27 MED ORDER — NEOMYCIN SULFATE 500 MG PO TABS: 1000.0000 mg | ORAL_TABLET | ORAL | Status: DC

## 2022-01-27 MED ORDER — LACTATED RINGERS IV SOLN
INTRAVENOUS | Status: DC | PRN
  Administered 2022-01-27: 1000 mL

## 2022-01-27 MED ORDER — PHENYLEPHRINE 40 MCG/ML (10ML) SYRINGE FOR IV PUSH (FOR BLOOD PRESSURE SUPPORT)
PREFILLED_SYRINGE | INTRAVENOUS | Status: AC
  Filled 2022-01-27: qty 10

## 2022-01-27 MED ORDER — BUPIVACAINE-EPINEPHRINE (PF) 0.5% -1:200000 IJ SOLN
INTRAMUSCULAR | Status: AC
  Filled 2022-01-27: qty 30

## 2022-01-27 MED ORDER — SUGAMMADEX SODIUM 200 MG/2ML IV SOLN
INTRAVENOUS | Status: DC | PRN
  Administered 2022-01-27: 300 mg via INTRAVENOUS

## 2022-01-27 MED ORDER — ENSURE PRE-SURGERY PO LIQD
296.0000 mL | Freq: Once | ORAL | Status: DC
  Filled 2022-01-27: qty 296

## 2022-01-27 MED ORDER — ROCURONIUM BROMIDE 10 MG/ML (PF) SYRINGE
PREFILLED_SYRINGE | INTRAVENOUS | Status: AC
  Filled 2022-01-27: qty 10

## 2022-01-27 MED ORDER — ENSURE PRE-SURGERY PO LIQD
592.0000 mL | Freq: Once | ORAL | Status: DC
  Filled 2022-01-27: qty 592

## 2022-01-27 MED ORDER — DEXAMETHASONE SODIUM PHOSPHATE 10 MG/ML IJ SOLN
INTRAMUSCULAR | Status: DC | PRN
  Administered 2022-01-27: 8 mg via INTRAVENOUS

## 2022-01-27 MED ORDER — BUPIVACAINE LIPOSOME 1.3 % IJ SUSP
INTRAMUSCULAR | Status: AC
  Filled 2022-01-27: qty 20

## 2022-01-27 MED ORDER — ORAL CARE MOUTH RINSE
15.0000 mL | Freq: Once | OROMUCOSAL | Status: AC
  Administered 2022-01-27: 15 mL via OROMUCOSAL

## 2022-01-27 MED ORDER — KETAMINE HCL 50 MG/5ML IJ SOSY
PREFILLED_SYRINGE | INTRAMUSCULAR | Status: AC
  Filled 2022-01-27: qty 5

## 2022-01-27 MED ORDER — EPHEDRINE 5 MG/ML INJ
INTRAVENOUS | Status: AC
  Filled 2022-01-27: qty 5

## 2022-01-27 MED ORDER — ONDANSETRON HCL 4 MG/2ML IJ SOLN
INTRAMUSCULAR | Status: DC | PRN
Start: 2022-01-27 — End: 2022-01-27
  Administered 2022-01-27: 4 mg via INTRAVENOUS

## 2022-01-27 MED ORDER — SODIUM CHLORIDE (PF) 0.9 % IJ SOLN
INTRAMUSCULAR | Status: AC
  Filled 2022-01-27: qty 20

## 2022-01-27 MED ORDER — ROCURONIUM BROMIDE 10 MG/ML (PF) SYRINGE
PREFILLED_SYRINGE | INTRAVENOUS | Status: DC | PRN
Start: 2022-01-27 — End: 2022-01-27
  Administered 2022-01-27: 70 mg via INTRAVENOUS

## 2022-01-27 MED ORDER — BUPIVACAINE LIPOSOME 1.3 % IJ SUSP
INTRAMUSCULAR | Status: DC | PRN
  Administered 2022-01-27: 20 mL

## 2022-01-27 MED ORDER — FENTANYL CITRATE (PF) 100 MCG/2ML IJ SOLN
INTRAMUSCULAR | Status: DC | PRN
Start: 2022-01-27 — End: 2022-01-27
  Administered 2022-01-27: 100 ug via INTRAVENOUS

## 2022-01-27 MED ORDER — METRONIDAZOLE 500 MG PO TABS
1000.0000 mg | ORAL_TABLET | ORAL | Status: DC
Start: 2022-01-27 — End: 2022-01-27

## 2022-01-27 MED ORDER — LACTATED RINGERS IV SOLN: INTRAVENOUS | Status: DC

## 2022-01-27 MED ORDER — PHENYLEPHRINE 40 MCG/ML (10ML) SYRINGE FOR IV PUSH (FOR BLOOD PRESSURE SUPPORT)
PREFILLED_SYRINGE | INTRAVENOUS | Status: DC | PRN
  Administered 2022-01-27: 160 ug via INTRAVENOUS

## 2022-01-27 MED ORDER — BUPIVACAINE LIPOSOME 1.3 % IJ SUSP: 20.0000 mL | Freq: Once | INTRAMUSCULAR | Status: DC

## 2022-01-27 MED ORDER — LIDOCAINE 2% (20 MG/ML) 5 ML SYRINGE
INTRAMUSCULAR | Status: DC | PRN
  Administered 2022-01-27: 60 mg via INTRAVENOUS

## 2022-01-27 MED ORDER — BISACODYL 5 MG PO TBEC: 20.0000 mg | DELAYED_RELEASE_TABLET | Freq: Once | ORAL | Status: DC

## 2022-01-27 MED ORDER — PHENYLEPHRINE HCL-NACL 20-0.9 MG/250ML-% IV SOLN
INTRAVENOUS | Status: DC | PRN
  Administered 2022-01-27: 45 ug/min via INTRAVENOUS

## 2022-01-27 MED ORDER — PROPOFOL 10 MG/ML IV BOLUS
INTRAVENOUS | Status: AC
  Filled 2022-01-27: qty 20

## 2022-01-27 MED ORDER — MIDAZOLAM HCL 5 MG/5ML IJ SOLN
INTRAMUSCULAR | Status: DC | PRN
  Administered 2022-01-27: 2 mg via INTRAVENOUS

## 2022-01-27 SURGICAL SUPPLY — 100 items
ADH SKN CLS APL DERMABOND .7 (GAUZE/BANDAGES/DRESSINGS) ×2
APL PRP STRL LF DISP 70% ISPRP (MISCELLANEOUS) ×2
APPLIER CLIP 5 13 M/L LIGAMAX5 (MISCELLANEOUS)
APPLIER CLIP ROT 10 11.4 M/L (STAPLE)
APR CLP MED LRG 11.4X10 (STAPLE)
APR CLP MED LRG 5 ANG JAW (MISCELLANEOUS)
BAG COUNTER SPONGE SURGICOUNT (BAG) ×1 IMPLANT
BAG SPEC RTRVL LRG 6X4 10 (ENDOMECHANICALS) ×2
BAG SPNG CNTER NS LX DISP (BAG)
BLADE EXTENDED COATED 6.5IN (ELECTRODE) IMPLANT
CABLE HIGH FREQUENCY MONO STRZ (ELECTRODE) IMPLANT
CATH MUSHROOM 28FR (CATHETERS) IMPLANT
CATH MUSHROOM 30FR (CATHETERS) IMPLANT
CELLS DAT CNTRL 66122 CELL SVR (MISCELLANEOUS) IMPLANT
CHLORAPREP W/TINT 26 (MISCELLANEOUS) ×3 IMPLANT
CLIP APPLIE 5 13 M/L LIGAMAX5 (MISCELLANEOUS) IMPLANT
CLIP APPLIE ROT 10 11.4 M/L (STAPLE) IMPLANT
COVER SURGICAL LIGHT HANDLE (MISCELLANEOUS) ×3 IMPLANT
CUTTER FLEX LINEAR 45M (STAPLE) ×3 IMPLANT
DERMABOND ADVANCED (GAUZE/BANDAGES/DRESSINGS) ×1
DERMABOND ADVANCED .7 DNX12 (GAUZE/BANDAGES/DRESSINGS) ×2 IMPLANT
DISSECTOR BLUNT TIP ENDO 5MM (MISCELLANEOUS) IMPLANT
DRAIN CHANNEL 19F RND (DRAIN) IMPLANT
DRAPE SURG IRRIG POUCH 19X23 (DRAPES) ×1 IMPLANT
DRSG OPSITE POSTOP 4X10 (GAUZE/BANDAGES/DRESSINGS) IMPLANT
DRSG OPSITE POSTOP 4X6 (GAUZE/BANDAGES/DRESSINGS) IMPLANT
DRSG OPSITE POSTOP 4X8 (GAUZE/BANDAGES/DRESSINGS) IMPLANT
ELECT PENCIL ROCKER SW 15FT (MISCELLANEOUS) ×3 IMPLANT
ELECT REM PT RETURN 15FT ADLT (MISCELLANEOUS) ×3 IMPLANT
ENDOLOOP SUT PDS II  0 18 (SUTURE)
ENDOLOOP SUT PDS II 0 18 (SUTURE) IMPLANT
EVACUATOR SILICONE 100CC (DRAIN) IMPLANT
GAUZE SPONGE 4X4 12PLY STRL (GAUZE/BANDAGES/DRESSINGS) IMPLANT
GLOVE SURG ENC MOIS LTX SZ7.5 (GLOVE) ×4 IMPLANT
GLOVE SURG UNDER LTX SZ8 (GLOVE) ×4 IMPLANT
GOWN STRL REUS W/TWL XL LVL3 (GOWN DISPOSABLE) ×8 IMPLANT
HOLDER FOLEY CATH W/STRAP (MISCELLANEOUS) ×1 IMPLANT
IRRIG SUCT STRYKERFLOW 2 WTIP (MISCELLANEOUS) ×3
IRRIGATION SUCT STRKRFLW 2 WTP (MISCELLANEOUS) ×2 IMPLANT
IV LACTATED RINGERS 1000ML (IV SOLUTION) ×2 IMPLANT
KIT BASIN OR (CUSTOM PROCEDURE TRAY) ×3 IMPLANT
KIT TURNOVER KIT A (KITS) IMPLANT
LIGASURE IMPACT 36 18CM CVD LR (INSTRUMENTS) IMPLANT
NDL INSUFFLATION 14GA 120MM (NEEDLE) IMPLANT
NEEDLE INSUFFLATION 14GA 120MM (NEEDLE) IMPLANT
NS IRRIG 1000ML POUR BTL (IV SOLUTION) ×2 IMPLANT
PACK COLON (CUSTOM PROCEDURE TRAY) ×1 IMPLANT
PAD POSITIONING PINK XL (MISCELLANEOUS) ×3 IMPLANT
PENCIL SMOKE EVACUATOR (MISCELLANEOUS) IMPLANT
POUCH SPECIMEN RETRIEVAL 10MM (ENDOMECHANICALS) ×3 IMPLANT
RELOAD 45 VASCULAR/THIN (ENDOMECHANICALS) IMPLANT
RELOAD PROXIMATE 75MM BLUE (ENDOMECHANICALS) IMPLANT
RELOAD STAPLE 45 2.5 WHT GRN (ENDOMECHANICALS) IMPLANT
RELOAD STAPLE 45 3.5 BLU ETS (ENDOMECHANICALS) IMPLANT
RELOAD STAPLE 75 3.8 BLU REG (ENDOMECHANICALS) IMPLANT
RELOAD STAPLE TA45 3.5 REG BLU (ENDOMECHANICALS) ×3 IMPLANT
RETRACTOR WND ALEXIS 18 MED (MISCELLANEOUS) IMPLANT
RTRCTR WOUND ALEXIS 18CM MED (MISCELLANEOUS)
SCISSORS LAP 5X35 DISP (ENDOMECHANICALS) ×3 IMPLANT
SEALER TISSUE G2 STRG ARTC 35C (ENDOMECHANICALS) ×3 IMPLANT
SET TUBE SMOKE EVAC HIGH FLOW (TUBING) ×3 IMPLANT
SHEARS HARMONIC ACE PLUS 36CM (ENDOMECHANICALS) ×1 IMPLANT
SLEEVE ADV FIXATION 5X100MM (TROCAR) ×4 IMPLANT
SPIKE FLUID TRANSFER (MISCELLANEOUS) ×3 IMPLANT
SPONGE DRAIN TRACH 4X4 STRL 2S (GAUZE/BANDAGES/DRESSINGS) IMPLANT
STAPLER GUN LINEAR PROX 60 (STAPLE) IMPLANT
STAPLER PROXIMATE 75MM BLUE (STAPLE) IMPLANT
STAPLER VISISTAT 35W (STAPLE) IMPLANT
SUT ETHILON 3 0 PS 1 (SUTURE) IMPLANT
SUT MNCRL AB 4-0 PS2 18 (SUTURE) ×3 IMPLANT
SUT PDS AB 1 CTX 36 (SUTURE) IMPLANT
SUT PDS AB 1 TP1 54 (SUTURE) IMPLANT
SUT PDS AB 1 TP1 96 (SUTURE) IMPLANT
SUT PROLENE 2 0 KS (SUTURE) ×1 IMPLANT
SUT PROLENE 2 0 SH DA (SUTURE) ×1 IMPLANT
SUT SILK 2 0 (SUTURE)
SUT SILK 2 0 SH CR/8 (SUTURE) ×1 IMPLANT
SUT SILK 2-0 18XBRD TIE 12 (SUTURE) ×1 IMPLANT
SUT SILK 3 0 (SUTURE)
SUT SILK 3 0 SH CR/8 (SUTURE) ×1 IMPLANT
SUT SILK 3-0 18XBRD TIE 12 (SUTURE) ×1 IMPLANT
SUT VIC AB 2-0 SH 27 (SUTURE)
SUT VIC AB 2-0 SH 27X BRD (SUTURE) IMPLANT
SUT VIC AB 3-0 SH 18 (SUTURE) IMPLANT
SUT VIC AB 3-0 SH 27 (SUTURE)
SUT VIC AB 3-0 SH 27X BRD (SUTURE) IMPLANT
SUT VICRYL 2 0 18  UND BR (SUTURE)
SUT VICRYL 2 0 18 UND BR (SUTURE) ×1 IMPLANT
SYS LAPSCP GELPORT 120MM (MISCELLANEOUS)
SYS WOUND ALEXIS 18CM MED (MISCELLANEOUS)
SYSTEM LAPSCP GELPORT 120MM (MISCELLANEOUS) IMPLANT
SYSTEM WOUND ALEXIS 18CM MED (MISCELLANEOUS) IMPLANT
TOWEL OR 17X26 10 PK STRL BLUE (TOWEL DISPOSABLE) IMPLANT
TOWEL OR NON WOVEN STRL DISP B (DISPOSABLE) ×3 IMPLANT
TRAY FOLEY MTR SLVR 14FR STAT (SET/KITS/TRAYS/PACK) ×3 IMPLANT
TRAY FOLEY MTR SLVR 16FR STAT (SET/KITS/TRAYS/PACK) ×1 IMPLANT
TRAY IRRIG W/60CC SYR STRL (SET/KITS/TRAYS/PACK) ×3 IMPLANT
TRAY LAPAROSCOPIC (CUSTOM PROCEDURE TRAY) ×3 IMPLANT
TROCAR ADV FIXATION 5X100MM (TROCAR) ×3 IMPLANT
TROCAR XCEL BLUNT TIP 100MML (ENDOMECHANICALS) ×3 IMPLANT

## 2022-01-27 NOTE — Anesthesia Procedure Notes (Signed)
Procedure Name: Intubation ?Date/Time: 01/27/2022 1:50 PM ?Performed by: Rosaland Lao, CRNA ?Pre-anesthesia Checklist: Patient identified, Emergency Drugs available, Suction available and Patient being monitored ?Patient Re-evaluated:Patient Re-evaluated prior to induction ?Oxygen Delivery Method: Circle system utilized ?Preoxygenation: Pre-oxygenation with 100% oxygen ?Induction Type: IV induction ?Ventilation: Mask ventilation without difficulty ?Laryngoscope Size: Glidescope and 3 ?Grade View: Grade I ?Tube type: Oral ?Tube size: 7.0 mm ?Number of attempts: 2 ?Airway Equipment and Method: Stylet ?Placement Confirmation: ETT inserted through vocal cords under direct vision, positive ETCO2 and breath sounds checked- equal and bilateral ?Secured at: 21 cm ?Tube secured with: Tape ?Dental Injury: Teeth and Oropharynx as per pre-operative assessment  ?Difficulty Due To: Difficulty was anticipated ?Comments: Dlx1 grade 4 view, glidescope for successful intubation and recommend glidescope for future intubations  ? ? ? ? ?

## 2022-01-27 NOTE — H&P (Signed)
CC: Here today for surgery  HPI: Julia Sloan is an 66 y.o. female with history of HTN, HLD, whom is seen in the office today as a referral by Dr. Loletha Carrow for evaluation of appendiceal orifice polyp.  She reports she had a positive stool study that triggered her first colonoscopy.  She underwent colonoscopy Dr. Loletha Carrow 07/05/2021. She was found to have a small polyp at the appendiceal orifice that was semisessile and biopsied. 2 additional transverse colon polyps were removed. Internal hemorrhoids. Otherwise normal exam.  Pathology: sessile serrated polyp   CT A/P 07/30/21 -no definitive mass was seen. The ovoid structure that radiology noted in her cecal base appears to correlate with her ileocecal valve. This does not correlate with what was seen endoscopically with regards to her hyperplastic polyp on 8/8 - this was quite small, flat and would not have been able to be seen on CT.  She delayed scheduling her appointment to see [place] out of anxiety.  We spent time reviewing her colonoscopy report. The pictures demonstrate this to be within the lumen of the appendiceal base. Appears amenable to removal with appendectomy.  She reports a lifelong history of some degree of constipation and takes Citrucel daily. She denies any abdominal symptoms regarding pain, bleeding, distention.  INTERVAL HX She denies changes in her health or health hx since we met in the office. Tolerated her bowel prep with satisfactory result. Denies complaints today.   PMH: HTN, HLD  PSH: C-sx x2 with tubal ligation at 2nd; denies any other abdominal/pelvic surgical hx  FHx: Denies any known family history of colorectal, breast, endometrial or ovarian cancer  Social Hx: Denies use of tobacco/EtOH/illicit drug. She works part time with [employer]  Past Medical History:  Diagnosis Date   Anxiety    Arthritis    neck   Astigmatism    Cancer ([place])    skin cancer   Constipation    GERD  (gastroesophageal reflux disease)    occasionally   History of kidney stones    Hyperlipidemia    Hypertension    Osteopenia    Presbyopia    Sleep apnea    mild sleep apnea    no  CPAP   Thyroid nodule     Past Surgical History:  Procedure Laterality Date   BIOPSY THYROID     CESAREAN SECTION  [id number]   CHOLECYSTECTOMY N/A 03/16/2017   Procedure: LAPAROSCOPIC CHOLECYSTECTOMY WITH INTRAOPERATIVE CHOLANGIOGRAM,;  Surgeon: Alphonsa Overall, MD;  Location: [facility];  Service: General;  Laterality: N/A;  lipoma on back   COSMETIC SURGERY  2004   facial   CYSTOSCOPY  1991   CYSTOSCOPY/URETEROSCOPY/HOLMIUM LASER Right 12/21/2015   Procedure: CYSTOSCOPY, RETROGRADE PYELOGRAM,  URETEROSCOPY, BASKET STONE EXTRACTION, INSERTION DOUBLE J STENT ;  Surgeon: Carolan Clines, MD;  Location: [facility];  Service: Urology;  Laterality: Right;   HEMORROIDECTOMY  1991   HOLMIUM LASER APPLICATION Right 5/36/4680   Procedure: HOLMIUM LASER APPLICATION;  Surgeon: Carolan Clines, MD;  Location: [facility];  Service: Urology;  Laterality: Right;   MASS EXCISION Right 03/16/2017   Procedure: EXCISION MASS;  Surgeon: Alphonsa Overall, MD;  Location: [facility];  Service: General;  Laterality: Right;    Family History  Problem Relation Age of Onset   Skin cancer Mother    Other Mother        congestive heart disease   Other Father        beginnings of prostate cancer  Heart disease Father        venous insufficient   Crohn's disease Brother    Colon cancer Neg Hx    Esophageal cancer Neg Hx    Rectal cancer Neg Hx    Stomach cancer Neg Hx     Social:  reports that she has never smoked. She has never used smokeless tobacco. She reports current alcohol use. She reports that she does not currently use drugs after having used the following drugs: Marijuana.  Allergies:  Allergies  Allergen Reactions   Amlodipine Besylate Other (See Comments)    Unknown - unsure of reaction  Cough?   Cephalexin Other (See  Comments)    Vaginal yeast infections   Chlorhexidine Itching    CHG clothes used to wipe prior to surgery caused itching.   Erythromycin Other (See Comments)    Stomach pain   Lisinopril     Cough    Losartan Cough   Nitrofurantoin Diarrhea    Other reaction(s): unsure occured in 2012   Olmesartan     Headaches    Olmesartan Medoxomil-Hctz Other (See Comments)    headaches   Sulfa Antibiotics     Stomach pain   Sulfamethoxazole-Trimethoprim     Other reaction(s): sulfa drug   Tetracyclines & Related     Pt preference: tooth discoloration.    Valsartan Cough   Zithromax [Azithromycin] Nausea And Vomiting   Ceftin [Cefuroxime] Nausea And Vomiting   Sulfamethoxazole Nausea Only    Medications: I have reviewed the patient's current medications.  Results for orders placed or performed during the hospital encounter of 01/27/22 (from the past 48 hour(s))  Glucose, capillary     Status: Abnormal   Collection Time: 01/27/22 12:07 PM  Result Value Ref Range   Glucose-Capillary 113 (H) 70 - 99 mg/dL    Comment: Glucose reference range applies only to samples taken after fasting for at least 8 hours.  ABO/Rh     Status: None   Collection Time: 01/27/22 12:13 PM  Result Value Ref Range   ABO/RH(D)      Jenetta Downer NEG Performed at [facility], [place] [address], [place], [place] [address]     No results found.  ROS - all of the below systems have been reviewed with the patient and positives are indicated with bold text General: chills, fever or night sweats Eyes: blurry vision or double vision ENT: epistaxis or sore throat Allergy/Immunology: itchy/watery eyes or nasal congestion Hematologic/Lymphatic: bleeding problems, blood clots or swollen lymph nodes Endocrine: temperature intolerance or unexpected weight changes Breast: new or changing breast lumps or nipple discharge Resp: cough, shortness of breath, or wheezing CV: chest pain or dyspnea on exertion GI: as  per HPI GU: dysuria, trouble voiding, or hematuria MSK: joint pain or joint stiffness Neuro: TIA or stroke symptoms Derm: pruritus and skin lesion changes Psych: anxiety and depression  PE Blood pressure (!) 160/88, pulse (!) 101, temperature 98.2 F (36.8 C), temperature source Oral, resp. rate 17, height '5\' 1"'  (1.549 m), weight 72.2 kg, SpO2 98 %. Constitutional: NAD; conversant Eyes: Moist conjunctiva; no lid lag; anicteric Lungs: Normal respiratory effort CV: RRR GI: Abd soft, NT/ND; no palpable hepatosplenomegaly MSK: Normal range of motion of extremities; Psychiatric: Appropriate affect; alert and oriented x3  Results for orders placed or performed during the hospital encounter of 01/27/22 (from the past 48 hour(s))  Glucose, capillary     Status: Abnormal   Collection Time: 01/27/22 12:07 PM  Result  Value Ref Range   Glucose-Capillary 113 (H) 70 - 99 mg/dL    Comment: Glucose reference range applies only to samples taken after fasting for at least 8 hours.  ABO/Rh     Status: None   Collection Time: 01/27/22 12:13 PM  Result Value Ref Range   ABO/RH(D)      Jenetta Downer NEG Performed at [facility], [place] [address], [place], [place] [address]     No results found.  Julia Sloan is an 66 y.o. female with hx of HTN, HLD here for evaluation of appendiceal oriface polyp - sessile serrated polyp - found on colonoscopy 06/2021 with Dr. Loletha Carrow  -The anatomy and physiology of the GI tract was reviewed with the patient. The pathophysiology of appendiceal orifice polyps was discussed as well with associated pictures. -We have discussed various different treatment options going forward including surgery (the most definitive) to address this - laparoscopic appendectomy; scenarios where ileocecectomy/right hemicolectomy may be necessary based on findings intraoperatively. -The planned procedure, material risks (including, but not limited to, pain, bleeding, infection,  scarring, need for blood transfusion, damage to surrounding structures- blood vessels/nerves/viscus/organs, damage to ureter, urine leak, leak from anastomosis, need for additional procedures, scenarios where a stoma may be necessary and where it may be permanent, worsening of pre-existing medical conditions, hernia, recurrence, pneumonia, heart attack, stroke, death) benefits and alternatives to surgery were discussed at length. The patient's questions were answered to her satisfaction, she voiced understanding and elected to proceed with surgery. Additionally, we discussed typical postoperative expectations and the recovery process.  Nadeen Landau, MD [facility], [place]

## 2022-01-27 NOTE — Discharge Instructions (Addendum)
POST OP INSTRUCTIONS  DIET: As tolerated. Follow a light bland diet the first 24 hours after arrival home, such as soup, liquids, crackers, etc.  Be sure to include lots of fluids daily.  Avoid fast food or heavy meals as your are more likely to get nauseated.  Eat a low fat the next few days after surgery.  Take your usually prescribed home medications unless otherwise directed.  PAIN CONTROL: Pain is best controlled by a usual combination of three different methods TOGETHER: Ice/Heat Over the counter pain medication Prescription pain medication Most patients will experience some swelling and bruising around the surgical site.  Ice packs or heating pads (30-60 minutes up to 6 times a day) will help. Some people prefer to use ice alone, heat alone, alternating between ice & heat.  Experiment to what works for you.  Swelling and bruising can take several weeks to resolve.   It is helpful to take an over-the-counter pain medication regularly for the first few weeks: Ibuprofen (Motrin/Advil) - 200mg tabs - take 3 tabs (600mg) every 6 hours as needed for pain Acetaminophen (Tylenol) - you may take 650mg every 6 hours as needed. You can take this with motrin as they act differently on the body. If you are taking a narcotic pain medication that has acetaminophen in it, do not take over the counter tylenol at the same time.  Iii. NOTE: You may take both of these medications together - most patients  find it most helpful when alternating between the two (i.e. Ibuprofen at 6am,  tylenol at 9am, ibuprofen at 12pm ...) A  prescription for pain medication should be given to you upon discharge.  Take your pain medication as prescribed if your pain is not adequatly controlled with the over-the-counter pain reliefs mentioned above.  Avoid getting constipated.  Between the surgery and the pain medications, it is common to experience some constipation.  Increasing fluid intake and taking a fiber supplement (such as  Metamucil, Citrucel, FiberCon, MiraLax, etc) 1-2 times a day regularly will usually help prevent this problem from occurring.  A mild laxative (prune juice, Milk of Magnesia, MiraLax, etc) should be taken according to package directions if there are no bowel movements after 48 hours.    Dressing: Your incisions are covered in Dermabond which is like sterile superglue for the skin. This will come off on it's own in a couple weeks. It is waterproof and you may bathe normally starting the day after your surgery in a shower. Avoid baths/pools/lakes/oceans until your wounds have fully healed.  ACTIVITIES as tolerated:   Avoid heavy lifting (>10lbs or 1 gallon of milk) for the next 6 weeks. You may resume regular (light) daily activities beginning the next day--such as daily self-care, walking, climbing stairs--gradually increasing activities as tolerated.  If you can walk 30 minutes without difficulty, it is safe to try more intense activity such as jogging, treadmill, bicycling, low-impact aerobics.  DO NOT PUSH THROUGH PAIN.  Let pain be your guide: If it hurts to do something, don't do it. You may drive when you are no longer taking prescription pain medication, you can comfortably wear a seatbelt, and you can safely maneuver your car and apply brakes.   FOLLOW UP in our office Please call [facility] at [phone] to set up an appointment to see your surgeon in the office for a follow-up appointment approximately 2 weeks after your surgery. Make sure that you call for this appointment the day you arrive home to   insure a convenient appointment time.  9. If you have disability or family leave forms that need to be completed, you may have them completed by your primary care physician's office; for return to work instructions, please ask our office staff and they will be happy to assist you in obtaining this documentation   When to call us [phone]: Poor pain control Reactions / problems with new  medications (rash/itching, etc)  Fever over 101.5 F (38.5 C) Inability to urinate Nausea/vomiting Worsening swelling or bruising Continued bleeding from incision. Increased pain, redness, or drainage from the incision  The clinic staff is available to answer your questions during regular business hours (8:30am-5pm).  Please don't hesitate to call and ask to speak to one of our nurses for clinical concerns.   A surgeon from [facility] is always on call at the hospitals   If you have a medical emergency, go to the nearest emergency room or call 911.  [facility] [address], [address], [place], [place]  [address] MAIN: [phone] FAX: [phone] [email or web address]  

## 2022-01-27 NOTE — Op Note (Signed)
Geraldo Docker ?[id number] ? ? ?PRE-OPERATIVE DIAGNOSIS:  Appendiceal orifice polyp  ? ?POST-OPERATIVE DIAGNOSIS:  Same ? ?PROCEDURE: Laparoscopic appendectomy ? ?SURGEON:  Sharon Mt. Sriram Febles, MD ? ?ASSISTANT: Earnie Larsson MD ? ?ANESTHESIA: General endotracheal ? ?EBL:   7.5 mL ? ?DRAINS: None ? ?SPECIMEN:  Appendix with cecal cuff ? ?COUNTS:  Sponge, needle and instrument counts were reported correct x2 at conclusion of the operation ? ?DISPOSITION:  PACU in satisfactory condition ? ?COMPLICATIONS: None ? ?FINDINGS: Somewhat diminutive appearing appendix distally but becomes more normal in caliber proximally.  No obvious mass.  Normal-appearing cecum and terminal ileum.  An appendectomy was carried out including a cuff of cecum.  The specimen was opened on the back table and found to contain said polypoid tissue which is quite subtle but does not appear to be involved with the staple line. ? ?DESCRIPTION:  ?The patient was identified & brought into the operating room. SCDs were in place and functioning. General endotracheal anesthesia was administered. Preoperative antibiotics were administered. The patient was positioned supine with left arm tucked. Hair on the abdomen was then clipped by the OR team. A foley catheter was inserted under sterile conditions. The abdomen was prepped and draped in the standard sterile fashion. A surgical timeout confirmed our plan. ? ?A small incision was made in the infraumbilical skin. The subcutaneous tissue was dissected and the umbilical stalk identified. The stalk was grasped with a Kocher and retracted outwardly. The infraumbilical fascia was exposed and incised. Peritoneal entry was carefully made bluntly. A 0 Vicryl purse-string suture was placed and then the [facility] port was introduced into the abdomen.  CO2 insufflation commenced to 43mmHg. The laparoscope was inserted and confirmed no evidence of trocar site complications. The patient was then positioned in  Trendelenburg. Two additional ports were placed - one in left lower quadrant and another in the suprapubic midline taking care to stay well above the bladder - 3 fingerbreadths above the pubic symphysis. The bed was then slightly tilted to place the left side down. ? ?Omentum was reflected cephalad.  The small bowel was retracted from the right lower quadrant.  The ileum, cecum, ascending colon were all normal in appearance.  The appendix is identified and somewhat diminutive over the majority of its body but becomes more normal in caliber as it approaches the cecum.  There is no gross wall thickening or abnormality apparent. ? ?The appendix was elevated.  The base of the appendix was circumferentially dissected taking care to preserve the cecum free of injury. The base was noted to be viable and healthy appearing. The base of the appendix was then stapled with a blue load, taking a healthy cuff of viable cecum, taking care to stay clear of the ileocecal valve but working to obtain negative colon margin as well. The mesoappendix was then ligated divided using the Enseal device. The mesoappendix was inspected and noted to be hemostatic. The appendix was placed in an EndoBag. ? ?The right lower quadrant was conservatively irrigated. Hemostasis was noted to be achieved - taking time to inspect the ligated mesoappendix, colon mesentery, and retroperitoneum. Staple line was noted to be intact on the cecum with no bleeding. There was no perforation or injury. ? ?The Endobag was removed from the umbilical port site.  I then went to a separate back table and opened the specimen.  The entire appendix and appendiceal orifice is included.  The polypoid-like tissue that was noted endoscopically is clearly within the margin of resection  and there is no gross abnormality out to the level of the cecal staple line. ? ?The left lower quadrant and suprapubic ports were removed under direct visualization. The CO2 was exhausted from  the abdomen. The umbilical fascia was then closed by closing the 0 Vicryl suture. The fascia was palpated and noted to be completely closed. The skin of all port sites was then approximated using 4-0 Monocryl suture. The incisions were covered with Dermabond. ? ?She was then awakened from general anesthesia, extubated, and transferred to a stretcher for transport to recovery in satisfactory condition. ?

## 2022-01-27 NOTE — Anesthesia Preprocedure Evaluation (Addendum)
Anesthesia Evaluation  ?Patient identified by MRN, date of birth, ID band ?Patient awake ? ? ? ?Reviewed: ?Allergy & Precautions, NPO status , Patient's Chart, lab work & pertinent test results, reviewed documented beta blocker date and time  ? ?Airway ?Mallampati: II ? ?TM Distance: >3 FB ?Neck ROM: Full ? ? ? Dental ?no notable dental hx. ?(+) Teeth Intact, Caps, Dental Advisory Given ?  ?Pulmonary ?sleep apnea ,  ?  ?Pulmonary exam normal ?breath sounds clear to auscultation ? ? ? ? ? ? Cardiovascular ?hypertension, Pt. on medications ?Normal cardiovascular exam ?Rhythm:Regular Rate:Normal ? ? ?  ?Neuro/Psych ?Anxiety negative neurological ROS ?   ? GI/Hepatic ?Neg liver ROS, GERD  Medicated and Controlled,Appendiceal polyp  ?  ?Endo/Other  ?Hyperlipidemia ?Obesity ? Renal/GU ?negative Renal ROSHx/o renal calculi  ?negative genitourinary ?  ?Musculoskeletal ? ?(+) Arthritis , Osteoarthritis,   ? Abdominal ?(+) + obese,   ?Peds ? Hematology ?negative hematology ROS ?(+)   ?Anesthesia Other Findings ? ? Reproductive/Obstetrics ? ?  ? ? ? ? ? ? ? ? ? ? ? ? ? ?  ?  ? ? ? ? ? ? ? ?Anesthesia Physical ?Anesthesia Plan ? ?ASA: 2 ? ?Anesthesia Plan: General  ? ?Post-op Pain Management: Ofirmev IV (intra-op)*, Precedex and Dilaudid IV  ? ?Induction: Intravenous ? ?PONV Risk Score and Plan: 4 or greater and Treatment may vary due to age or medical condition and Ondansetron ? ?Airway Management Planned: Oral ETT ? ?Additional Equipment:  ? ?Intra-op Plan:  ? ?Post-operative Plan: Extubation in OR ? ?Informed Consent: I have reviewed the patients History and Physical, chart, labs and discussed the procedure including the risks, benefits and alternatives for the proposed anesthesia with the patient or authorized representative who has indicated his/her understanding and acceptance.  ? ? ? ?Dental advisory given ? ?Plan Discussed with: CRNA and Anesthesiologist ? ?Anesthesia Plan Comments:    ? ? ? ? ? ? ?Anesthesia Quick Evaluation ? ?

## 2022-01-27 NOTE — Anesthesia Postprocedure Evaluation (Signed)
Anesthesia Post Note ? ?Patient: Julia Sloan ? ?Procedure(s) Performed: LAPAROSCOPIC APPENDECTOMY (Abdomen) ? ?  ? ?Patient location during evaluation: PACU ?Anesthesia Type: General ?Level of consciousness: awake and alert and oriented ?Pain management: pain level controlled ?Vital Signs Assessment: post-procedure vital signs reviewed and stable ?Respiratory status: spontaneous breathing, nonlabored ventilation and respiratory function stable ?Cardiovascular status: blood pressure returned to baseline and stable ?Postop Assessment: no apparent nausea or vomiting ?Anesthetic complications: no ? ? ?No notable events documented. ? ?Last Vitals:  ?Vitals:  ? 01/27/22 1459 01/27/22 1515  ?BP: (!) 137/92 130/76  ?Pulse: (!) 104 73  ?Resp: 15 19  ?Temp:    ?SpO2: 100% 95%  ?  ?Last Pain:  ?Vitals:  ? 01/27/22 1515  ?TempSrc:   ?PainSc: 0-No pain  ? ? ?  ?  ?  ?  ?  ?  ? ?Elin Fenley A. ? ? ? ? ?

## 2022-01-27 NOTE — Transfer of Care (Signed)
Immediate Anesthesia Transfer of Care Note ? ?Patient: Julia Sloan ? ?Procedure(s) Performed: LAPAROSCOPIC APPENDECTOMY (Abdomen) ? ?Patient Location: PACU ? ?Anesthesia Type:General ? ?Level of Consciousness: awake, alert  and oriented ? ?Airway & Oxygen Therapy: Patient Spontanous Breathing ? ?Post-op Assessment: Report given to RN and Post -op Vital signs reviewed and stable ? ?Post vital signs: Reviewed and stable ? ?Last Vitals:  ?Vitals Value Taken Time  ?BP 137/92 01/27/22 1459  ?Temp    ?Pulse 100 01/27/22 1501  ?Resp 19 01/27/22 1501  ?SpO2 97 % 01/27/22 1501  ?Vitals shown include unvalidated device data. ? ?Last Pain:  ?Vitals:  ? 01/27/22 1459  ?TempSrc:   ?PainSc: 0-No pain  ?   ? ?  ? ?Complications: No notable events documented. ?

## 2022-01-28 ENCOUNTER — Encounter (HOSPITAL_COMMUNITY): Payer: Self-pay | Admitting: Surgery

## 2022-01-31 LAB — SURGICAL PATHOLOGY

## 2022-02-17 ENCOUNTER — Telehealth: Payer: Self-pay

## 2022-02-17 NOTE — Telephone Encounter (Signed)
Recall is already in epic for 8/24. ?My chart message sent to patient regarding recall colonoscopy. ?

## 2022-02-17 NOTE — Telephone Encounter (Signed)
-----   Message from Doran Stabler, MD sent at 02/17/2022  1:03 PM EDT ----- ?Regarding: RE: Follow-up ?Thanks Gerald Stabs. ? ?Adriana Quinby, please put in a 1 year recall for colonoscopy if there is not one and already. ?Sent her chart message to let her know that  will be the plan. ? ?HD ?----- Message ----- ?From: Ileana Roup, MD ?Sent: 02/16/2022  10:27 AM EDT ?To: Doran Stabler, MD ?Subject: Follow-up                                     ? ?Dionicia Abler - hope things are going well over at [facility]. This lady was sent by you and underwent essentially appendectomy for her AO poylpoid lesion. Path showed SSP. I told her I'd reach out to you about long-term follow-up for endoscopic surveillance of her colon. This is out so I figured I'd let you decide when you wanted her next scope to be... but wanted to close the loop. Thanks for sending her our way! ? ?Gerald Stabs ? ? ?

## 2023-04-26 ENCOUNTER — Encounter: Payer: Self-pay | Admitting: Gastroenterology

## 2024-04-29 ENCOUNTER — Other Ambulatory Visit: Payer: Self-pay | Admitting: Internal Medicine

## 2024-04-29 DIAGNOSIS — Z1231 Encounter for screening mammogram for malignant neoplasm of breast: Secondary | ICD-10-CM

## 2024-05-09 ENCOUNTER — Ambulatory Visit
Admission: RE | Admit: 2024-05-09 | Discharge: 2024-05-09 | Disposition: A | Discharge status: Home / Self Care | Source: Ambulatory Visit | Attending: Internal Medicine | Admitting: Internal Medicine

## 2024-05-09 DIAGNOSIS — Z1231 Encounter for screening mammogram for malignant neoplasm of breast: Secondary | ICD-10-CM
